# Patient Record
Sex: Female | Born: 1965 | Race: White | Hispanic: No | Marital: Married | Smoking: Never smoker
Health system: Southern US, Community
[De-identification: ages and names within clinical notes are randomized; demographics above are authoritative.]

## PROBLEM LIST (undated history)

## (undated) ENCOUNTER — Emergency Department (HOSPITAL_COMMUNITY): Payer: 59 | Source: Home / Self Care

## (undated) DIAGNOSIS — I219 Acute myocardial infarction, unspecified: Secondary | ICD-10-CM

## (undated) DIAGNOSIS — I2510 Atherosclerotic heart disease of native coronary artery without angina pectoris: Secondary | ICD-10-CM

## (undated) DIAGNOSIS — H9190 Unspecified hearing loss, unspecified ear: Secondary | ICD-10-CM

## (undated) DIAGNOSIS — R7303 Prediabetes: Secondary | ICD-10-CM

## (undated) DIAGNOSIS — H8109 Meniere's disease, unspecified ear: Secondary | ICD-10-CM

## (undated) DIAGNOSIS — E785 Hyperlipidemia, unspecified: Secondary | ICD-10-CM

## (undated) DIAGNOSIS — T7840XA Allergy, unspecified, initial encounter: Secondary | ICD-10-CM

## (undated) HISTORY — PX: PARTIAL HIP ARTHROPLASTY: SHX733

## (undated) HISTORY — PX: COCHLEAR IMPLANT: SUR684

## (undated) HISTORY — PX: OTHER SURGICAL HISTORY: SHX169

## (undated) HISTORY — PX: JOINT REPLACEMENT: SHX530

## (undated) HISTORY — PX: TUBAL LIGATION: SHX77

## (undated) HISTORY — PX: APPENDECTOMY: SHX54

## (undated) HISTORY — DX: Allergy, unspecified, initial encounter: T78.40XA

---

## 2007-09-23 ENCOUNTER — Emergency Department (HOSPITAL_COMMUNITY): Admission: EM | Admit: 2007-09-23 | Discharge: 2007-09-23 | Payer: Self-pay | Admitting: Emergency Medicine

## 2008-10-01 ENCOUNTER — Encounter: Admission: RE | Admit: 2008-10-01 | Discharge: 2008-10-01 | Payer: Self-pay | Admitting: Family Medicine

## 2008-10-27 ENCOUNTER — Encounter: Admission: RE | Admit: 2008-10-27 | Discharge: 2008-10-27 | Payer: Self-pay | Admitting: Gastroenterology

## 2008-11-05 ENCOUNTER — Ambulatory Visit (HOSPITAL_COMMUNITY): Admission: RE | Admit: 2008-11-05 | Discharge: 2008-11-05 | Payer: Self-pay | Admitting: Gastroenterology

## 2008-11-16 ENCOUNTER — Encounter (INDEPENDENT_AMBULATORY_CARE_PROVIDER_SITE_OTHER): Payer: Self-pay | Admitting: Orthopedic Surgery

## 2008-11-19 ENCOUNTER — Inpatient Hospital Stay (HOSPITAL_COMMUNITY): Admission: RE | Admit: 2008-11-19 | Discharge: 2008-11-21 | Payer: Self-pay | Admitting: Orthopedic Surgery

## 2010-02-26 IMAGING — RF DG ESOPHAGUS
20 of 21 series · 20 of 21 positions shown · non-contrast
Comparison: None

CLINICAL DATA: Pain after endoscopy

ESOPHOGRAM / BARIUM SWALLOW
TECHNIQUE: Single contrast examination was performed using and .
Fluoroscopy time:  2.6 minutes.

[Series 1: run · 1 of 1 slices shown (1 of 20)]
[im 1/1]
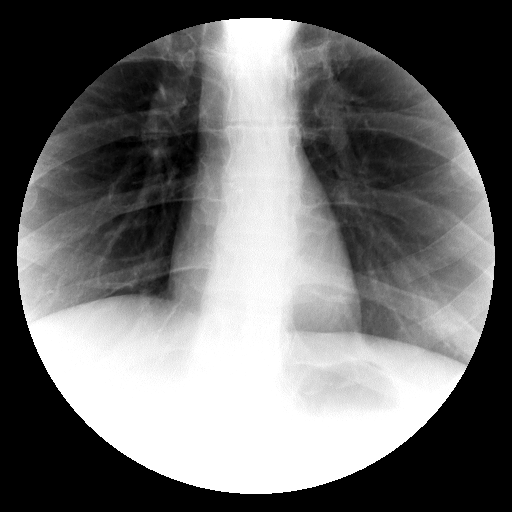

[Series 2: run · 1 of 1 slices shown (2 of 20)]
[im 1/1]
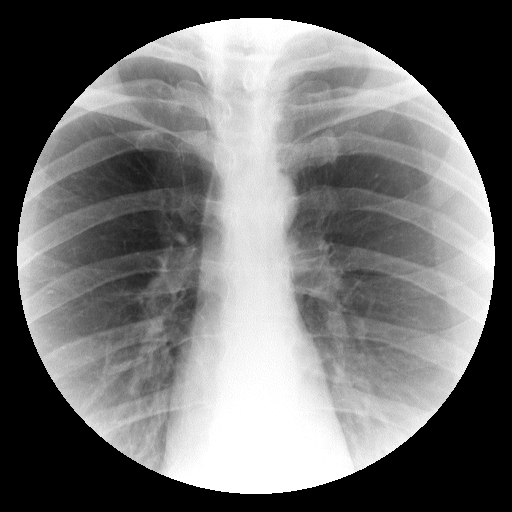

[Series 3: run · 1 of 1 slices shown (3 of 20)]
[im 1/1]
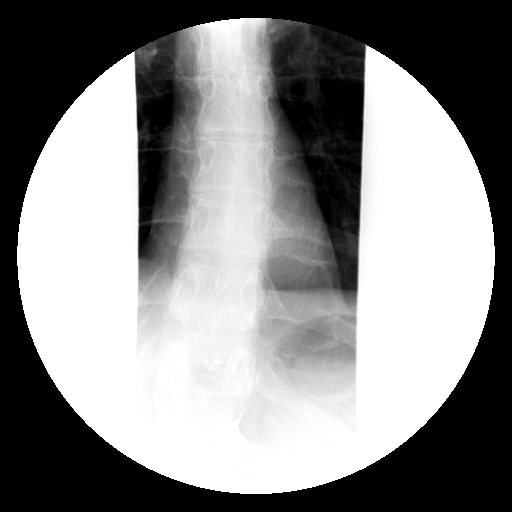

[Series 4: run · 1 of 1 slices shown (4 of 20)]
[im 1/1]
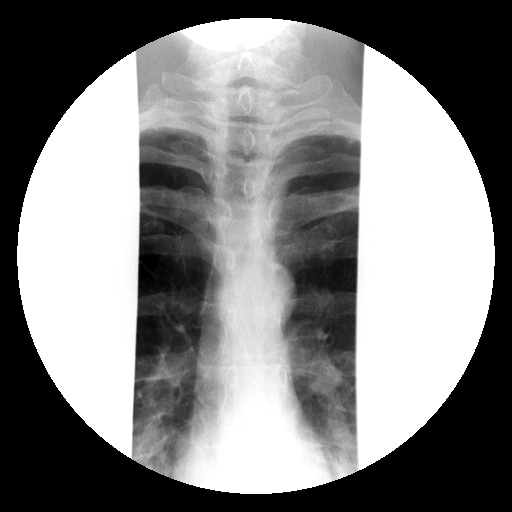

[Series 5: run · 1 of 1 slices shown (5 of 20)]
[im 1/1]
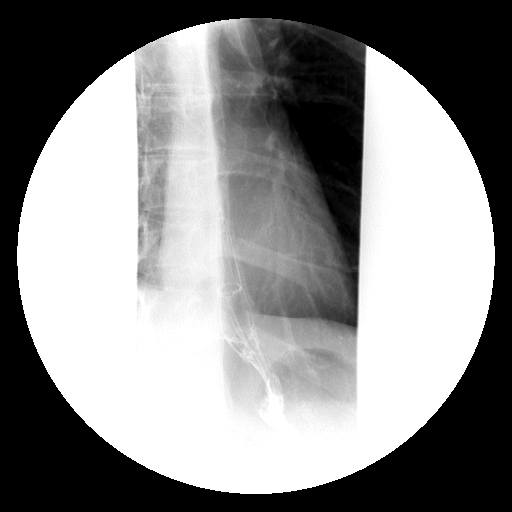

[Series 6: run · 1 of 1 slices shown (6 of 20)]
[im 1/1]
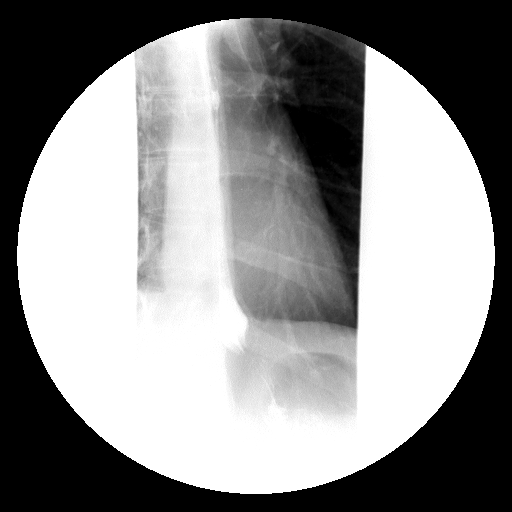

[Series 7: run · 1 of 1 slices shown (7 of 20)]
[im 1/1]
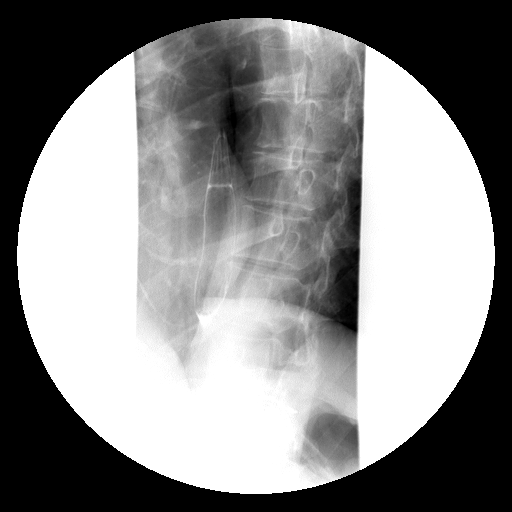

[Series 8: run · 1 of 1 slices shown (8 of 20)]
[im 1/1]
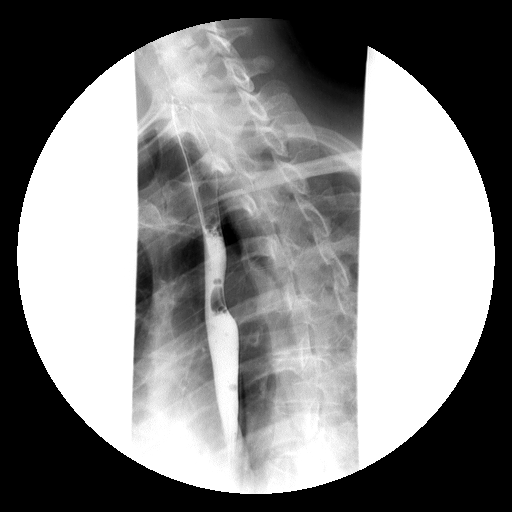

[Series 9: run · 1 of 1 slices shown (9 of 20)]
[im 1/1]
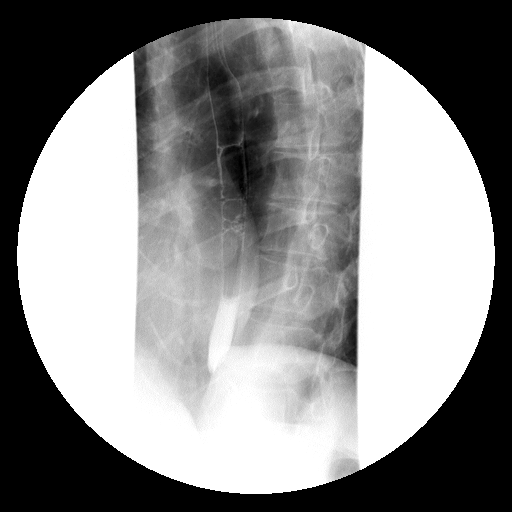

[Series 10: run · 1 of 1 slices shown (10 of 20)]
[im 1/1]
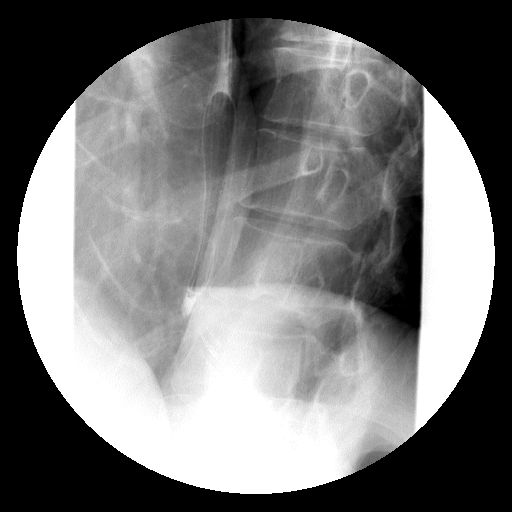

[Series 12: run · 1 of 1 slices shown (11 of 20)]
[im 1/1]
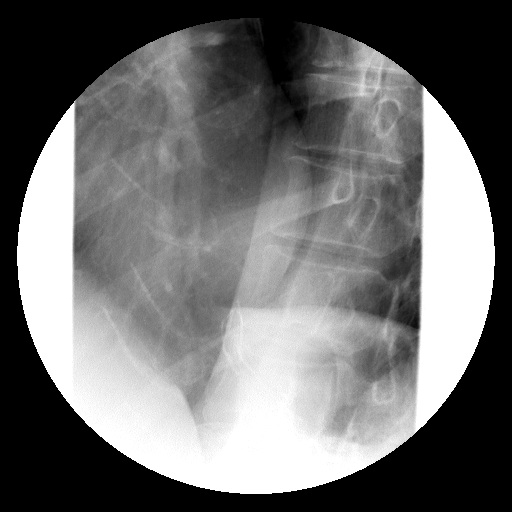

[Series 13: run · 1 of 1 slices shown (12 of 20)]
[im 1/1]
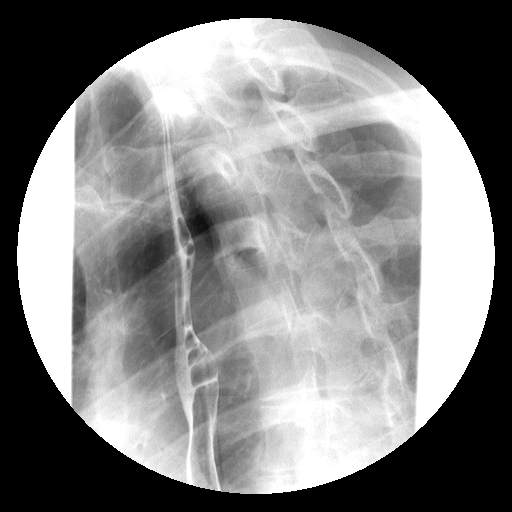

[Series 14: run · 1 of 1 slices shown (13 of 20)]
[im 1/1]
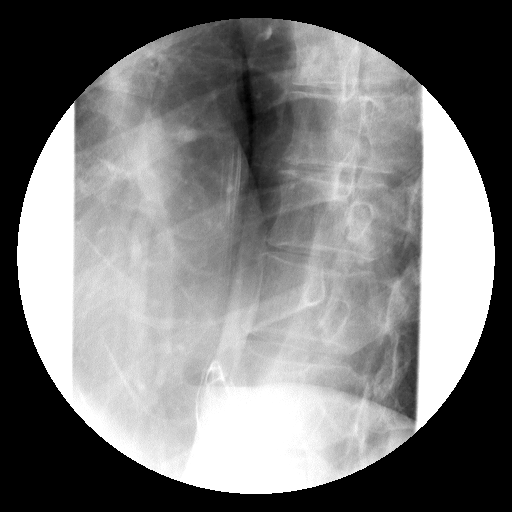

[Series 15: run · 1 of 1 slices shown (14 of 20)]
[im 1/1]
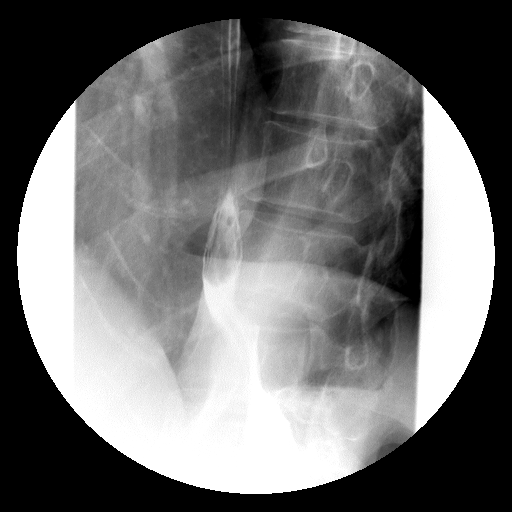

[Series 16: run · 1 of 1 slices shown (15 of 20)]
[im 1/1]
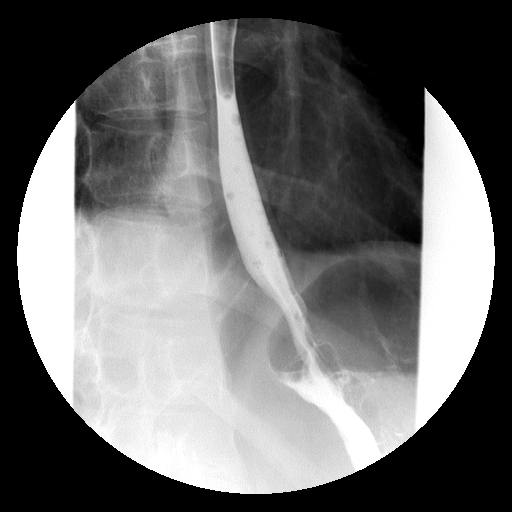

[Series 17: run · 1 of 1 slices shown (16 of 20)]
[im 1/1]
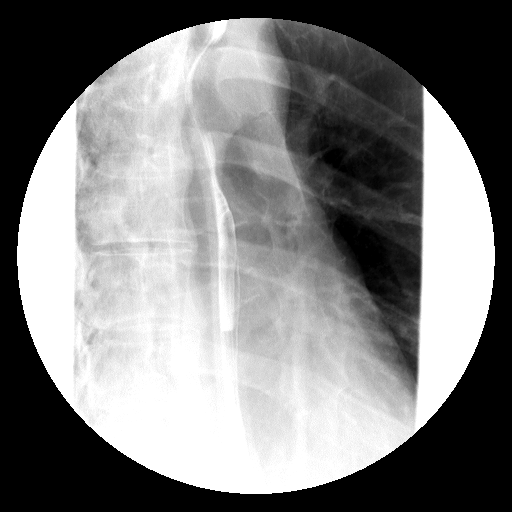

[Series 18: run · 1 of 1 slices shown (17 of 20)]
[im 1/1]
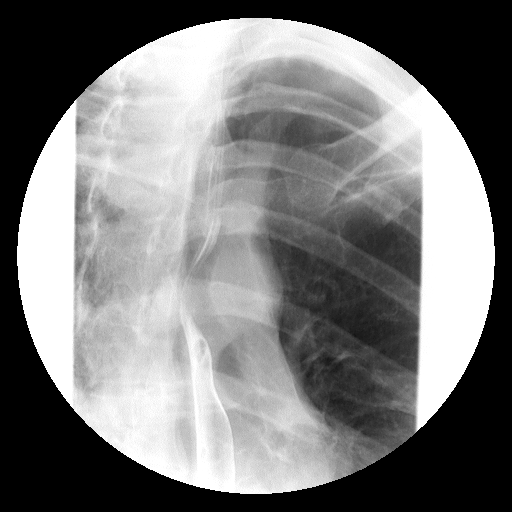

[Series 19: run · 1 of 1 slices shown (18 of 20)]
[im 1/1]
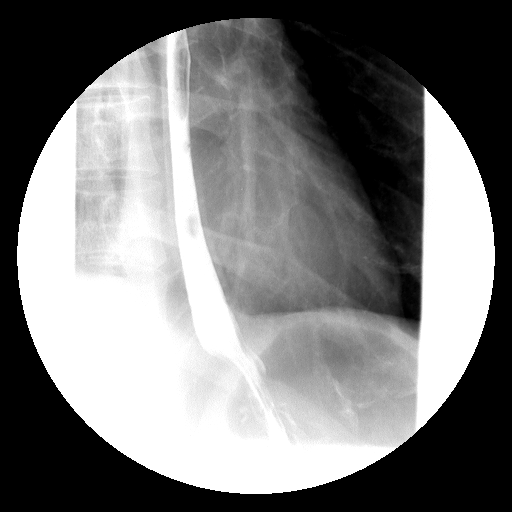

[Series 20: run · 1 of 1 slices shown (19 of 20)]
[im 1/1]
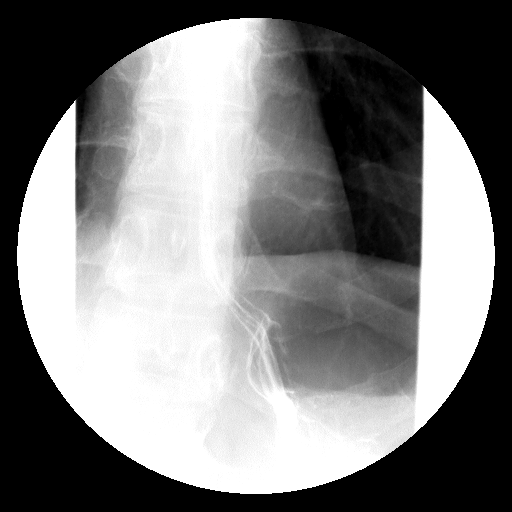

[Series 21: run · 1 of 1 slices shown (20 of 20)]
[im 1/1]
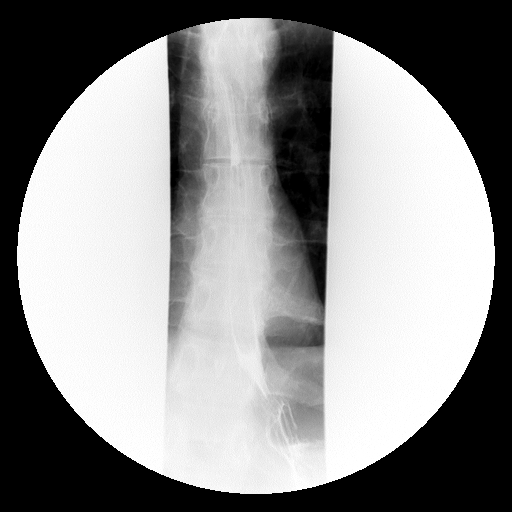

[20 of 21 positions shown; findings below may reference images not displayed]

FINDINGS: The patient was administered water soluble contrast
followed by thin barium.     No evidence of esophageal leak or
perforation.
IMPRESSION: No evidence of esophageal leak or perforation.

## 2010-03-17 IMAGING — CR DG CHEST 2V
2 series · 2 of 2 positions shown · non-contrast
Comparison: None

CLINICAL DATA: preadmit hip surgery.

CHEST - 1 VIEW

[view not recorded (1 of 2)]
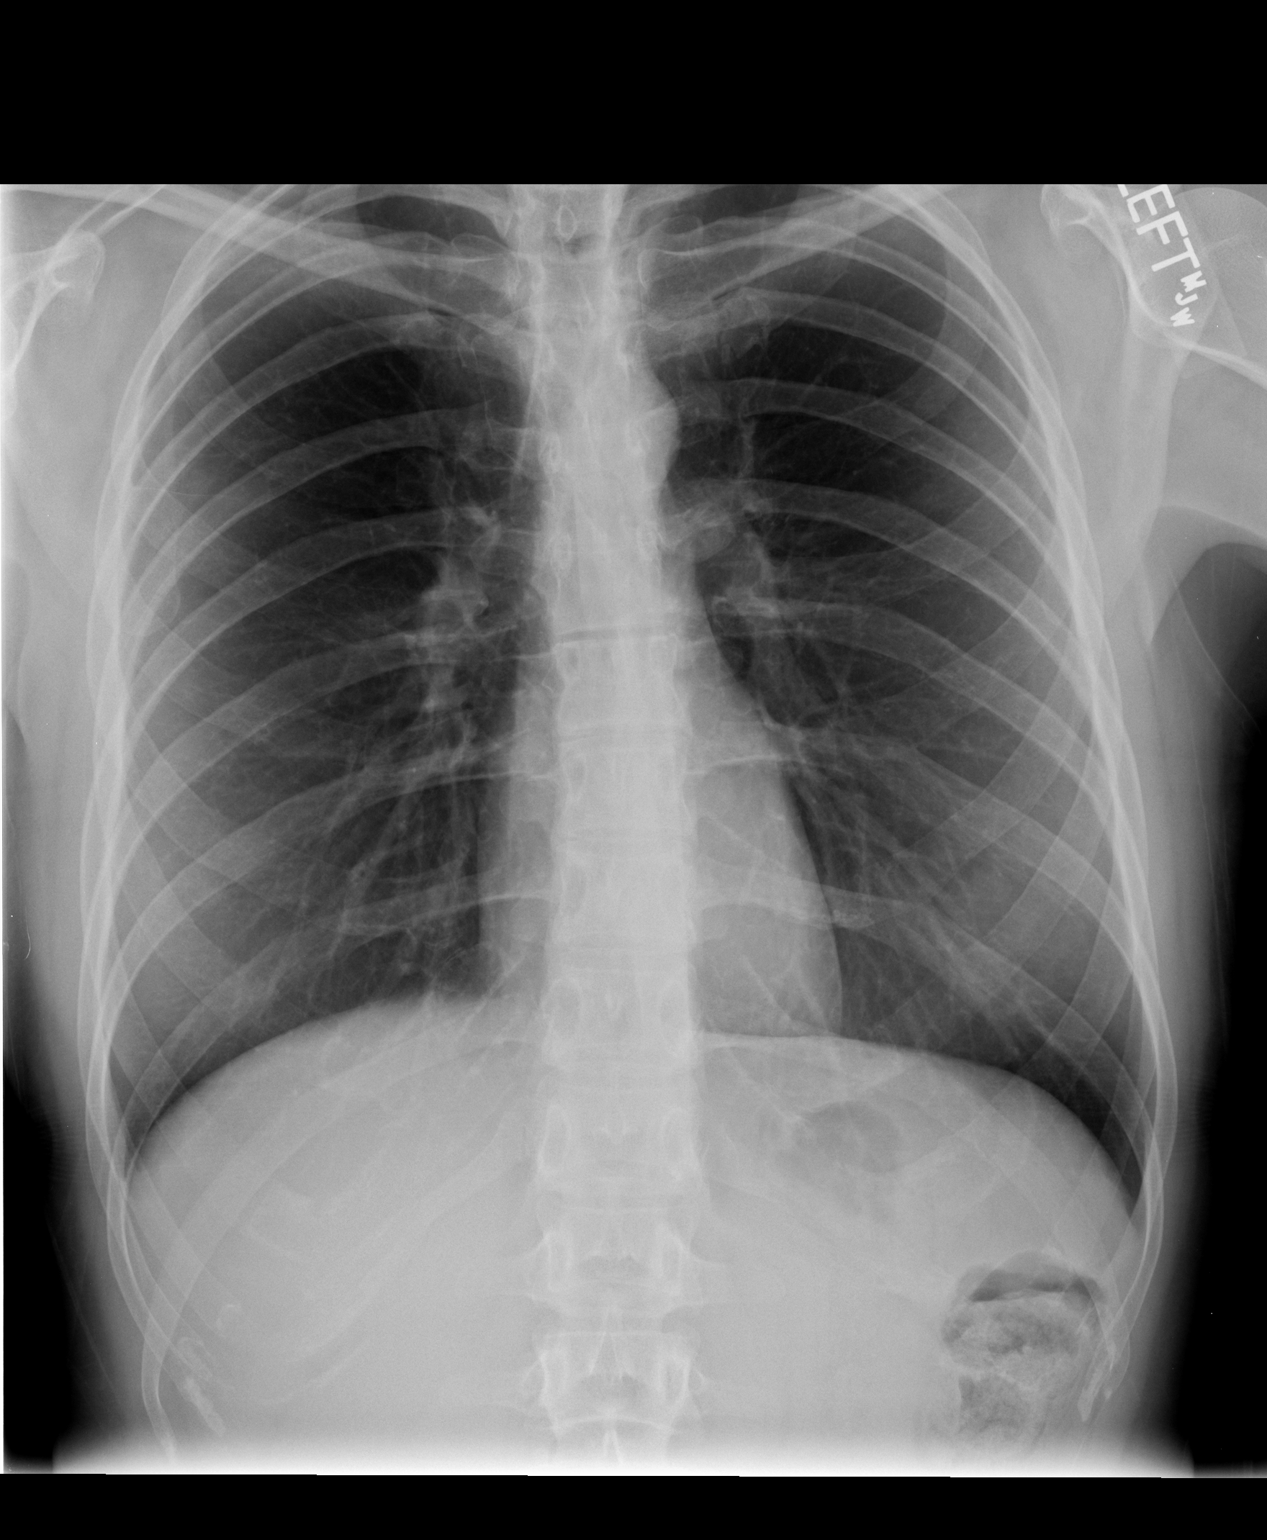

[view not recorded (2 of 2)]
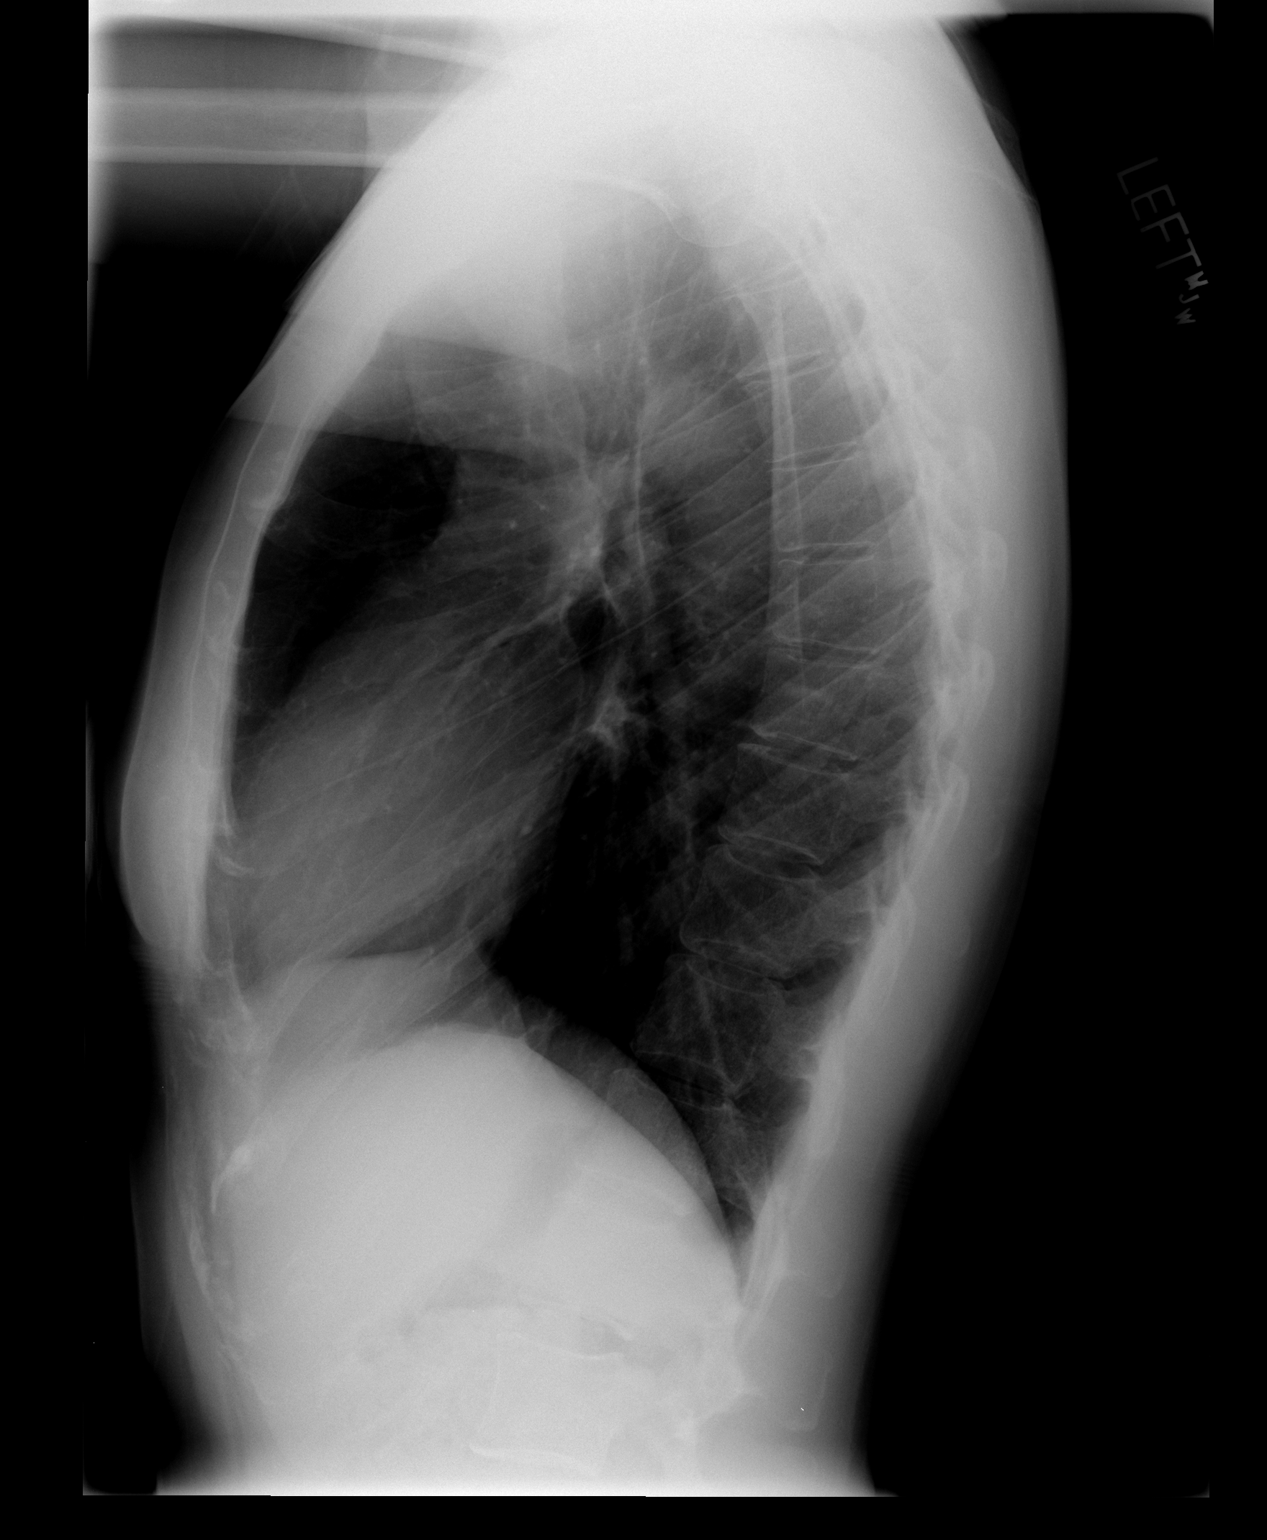

[2 of 2 positions shown; findings below may reference images not displayed]

FINDINGS: The heart size and mediastinal contours are within normal
limits.  Both lungs are clear.
IMPRESSION: No active disease.

## 2010-03-21 IMAGING — CR DG PORTABLE PELVIS
1 series · 1 of 1 positions shown · non-contrast
Comparison: None.

CLINICAL DATA: 42-year-old female status post left total hip
arthroplasty.

PORTABLE PELVIS,
PORTABLE LEFT HIP - 1 VIEW
,

[AP]
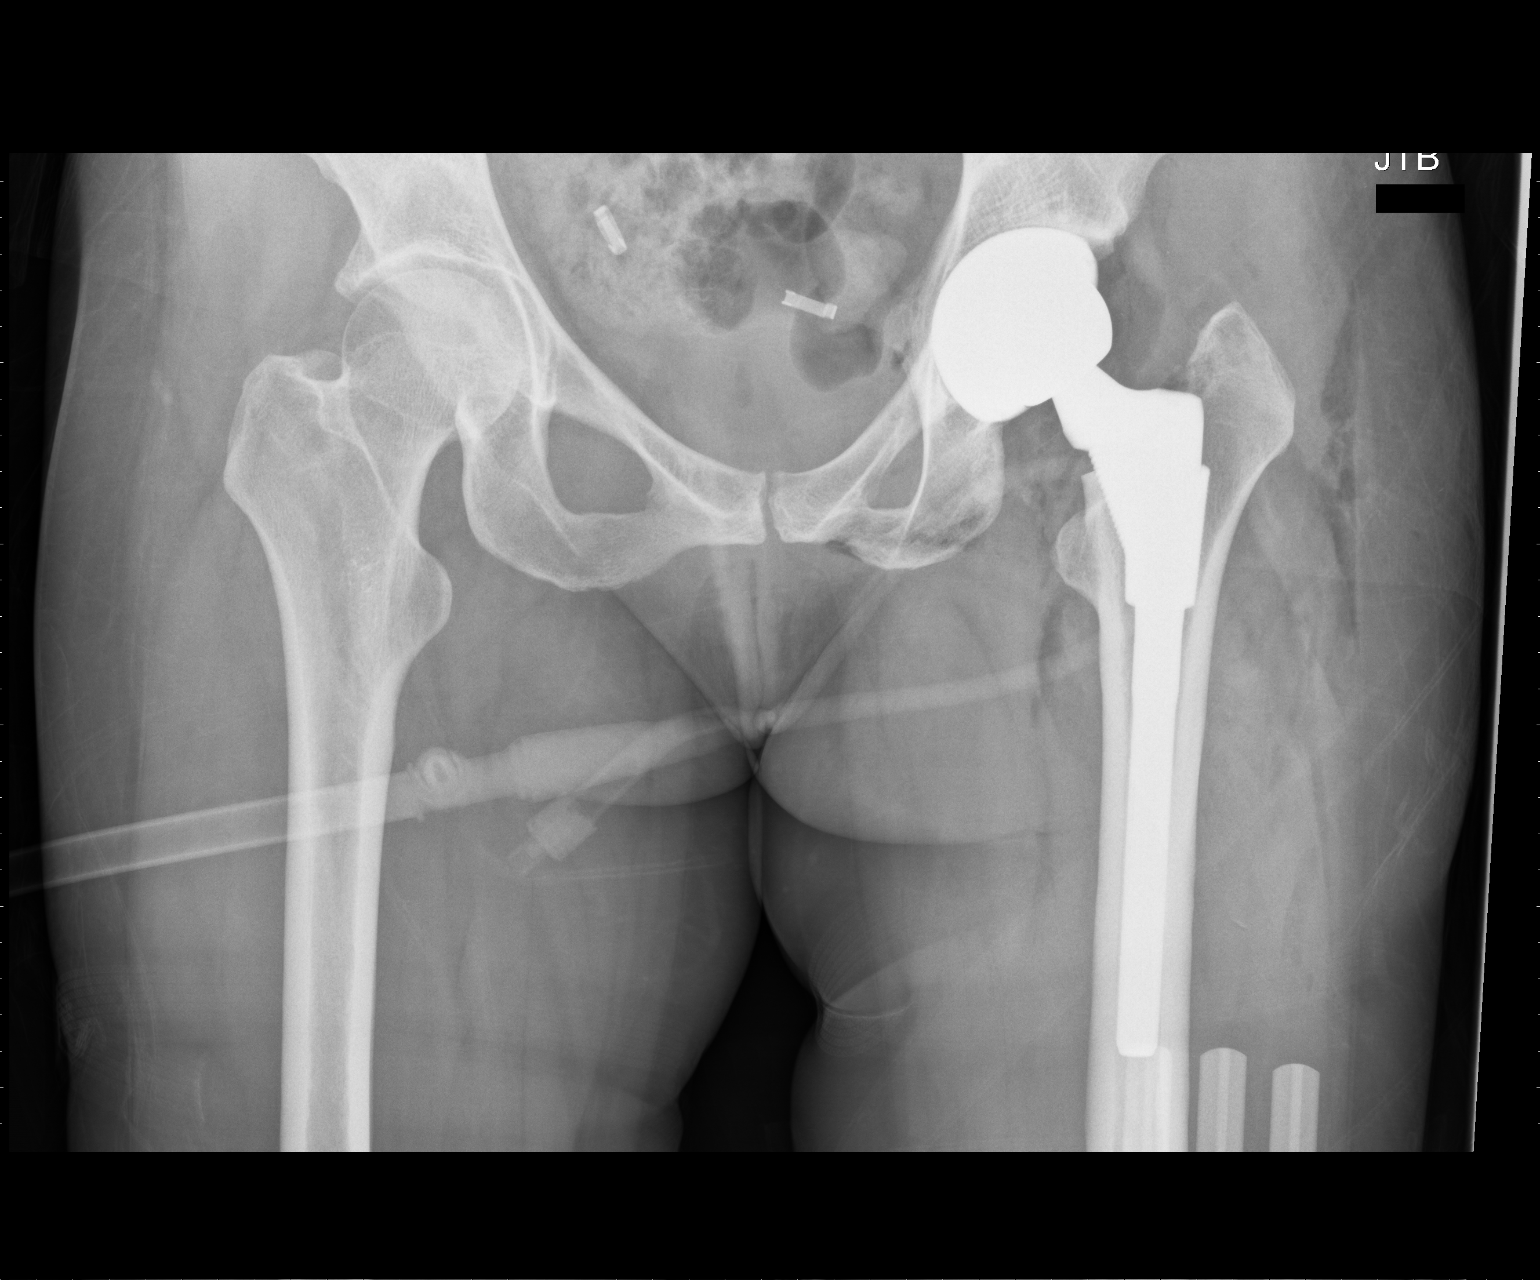

[1 of 1 positions shown; findings below may reference images not displayed]

FINDINGS: AP supine and portable cross-table lateral view views of
the left hip and pelvis.  Postoperative changes about the left hip
with noncemented total arthroplasty components.  Alignment is
within normal limits.   Aside from surgical osteotomy, no acute
osseous abnormality identified.  Visualized pelvis and right femur
appear intact.
IMPRESSION: Left total hip arthroplasty, no adverse features.

## 2010-03-21 IMAGING — CR DG HIP 1V PORT*L*
1 series · 1 of 1 positions shown · non-contrast
Comparison: None.

CLINICAL DATA: 42-year-old female status post left total hip
arthroplasty.

PORTABLE PELVIS,
PORTABLE LEFT HIP - 1 VIEW
,

[cross table lat hip]
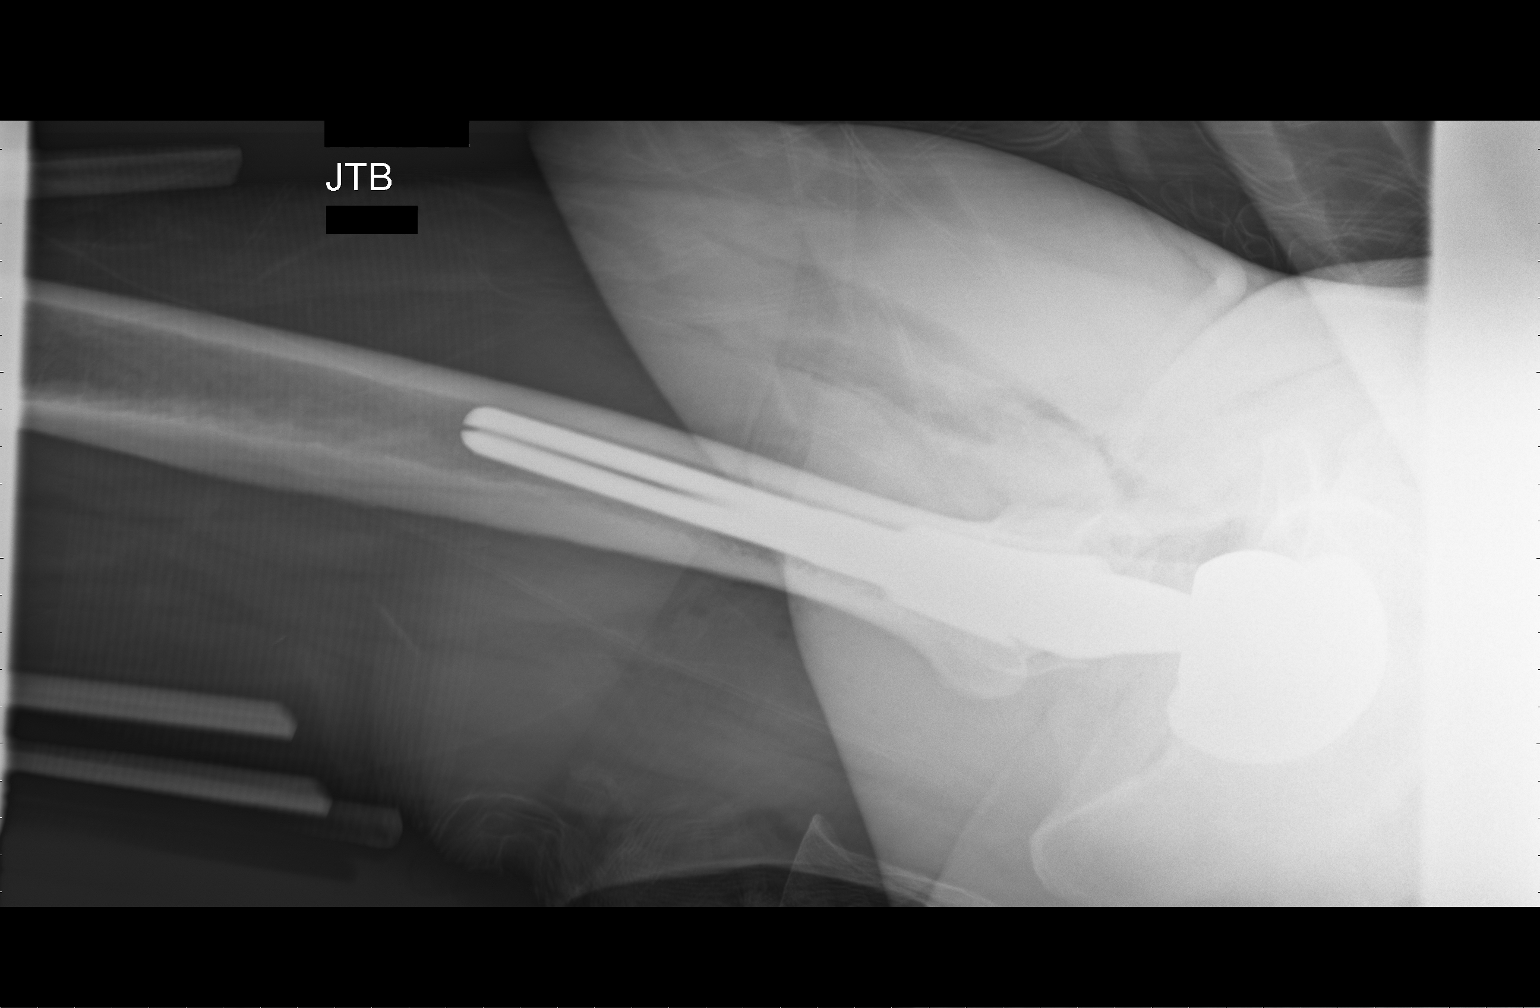

[1 of 1 positions shown; findings below may reference images not displayed]

FINDINGS: AP supine and portable cross-table lateral view views of
the left hip and pelvis.  Postoperative changes about the left hip
with noncemented total arthroplasty components.  Alignment is
within normal limits.   Aside from surgical osteotomy, no acute
osseous abnormality identified.  Visualized pelvis and right femur
appear intact.
IMPRESSION: Left total hip arthroplasty, no adverse features.

## 2010-09-03 ENCOUNTER — Encounter: Payer: Self-pay | Admitting: Emergency Medicine

## 2010-11-22 LAB — URINALYSIS, ROUTINE W REFLEX MICROSCOPIC
Specific Gravity, Urine: 1.017 (ref 1.005–1.030)
Urobilinogen, UA: 0.2 mg/dL (ref 0.0–1.0)
pH: 7 (ref 5.0–8.0)

## 2010-11-22 LAB — CBC
HCT: 29.9 % — ABNORMAL LOW (ref 36.0–46.0)
HCT: 31.9 % — ABNORMAL LOW (ref 36.0–46.0)
HCT: 43.4 % (ref 36.0–46.0)
Hemoglobin: 11.2 g/dL — ABNORMAL LOW (ref 12.0–15.0)
Hemoglobin: 15.3 g/dL — ABNORMAL HIGH (ref 12.0–15.0)
MCHC: 35.2 g/dL (ref 30.0–36.0)
MCV: 94.2 fL (ref 78.0–100.0)
MCV: 94.6 fL (ref 78.0–100.0)
MCV: 94.8 fL (ref 78.0–100.0)
RBC: 3.16 MIL/uL — ABNORMAL LOW (ref 3.87–5.11)
RBC: 4.61 MIL/uL (ref 3.87–5.11)
RDW: 12.8 % (ref 11.5–15.5)
WBC: 5.5 10*3/uL (ref 4.0–10.5)
WBC: 6.2 10*3/uL (ref 4.0–10.5)

## 2010-11-22 LAB — COMPREHENSIVE METABOLIC PANEL
ALT: 14 U/L (ref 0–35)
AST: 20 U/L (ref 0–37)
Albumin: 4.6 g/dL (ref 3.5–5.2)
Alkaline Phosphatase: 49 U/L (ref 39–117)
Calcium: 9.9 mg/dL (ref 8.4–10.5)
Chloride: 101 mEq/L (ref 96–112)
Total Protein: 7.7 g/dL (ref 6.0–8.3)

## 2010-11-22 LAB — URINE MICROSCOPIC-ADD ON

## 2010-11-22 LAB — PROTIME-INR: INR: 1.1 (ref 0.00–1.49)

## 2010-11-22 LAB — BASIC METABOLIC PANEL
BUN: 6 mg/dL (ref 6–23)
CO2: 29 mEq/L (ref 19–32)
CO2: 32 mEq/L (ref 19–32)
Chloride: 102 mEq/L (ref 96–112)
GFR calc Af Amer: >60 mL/min (ref >60)
Glucose, Bld: 111 mg/dL — ABNORMAL HIGH (ref 70–99)
Potassium: 2.9 mEq/L — ABNORMAL LOW (ref 3.5–5.1)
Potassium: 3.2 mEq/L — ABNORMAL LOW (ref 3.5–5.1)
Sodium: 138 mEq/L (ref 135–145)

## 2010-11-22 LAB — DIFFERENTIAL
Eosinophils Absolute: 0 10*3/uL (ref 0.0–0.7)
Eosinophils Relative: 1 % (ref 0–5)
Lymphs Abs: 1.5 10*3/uL (ref 0.7–4.0)
Monocytes Absolute: 0.3 10*3/uL (ref 0.1–1.0)
Monocytes Relative: 6 % (ref 3–12)
Neutrophils Relative %: 65 % (ref 43–77)

## 2010-11-22 LAB — ABO/RH: ABO/RH(D): O POS

## 2010-12-26 NOTE — Op Note (Signed)
Norma Bell, Norma Bell                ACCOUNT NO.:  [id number]   MEDICAL RECORD NO.:  [id number]          PATIENT TYPE:  INP   LOCATION:  5007                         FACILITY:  [facility]   PHYSICIAN:  Harvie Junior, M.D.   DATE OF BIRTH:  08-Feb-1966   DATE OF PROCEDURE:  11/19/2008  DATE OF DISCHARGE:                               OPERATIVE REPORT   PREOPERATIVE DIAGNOSIS:  End-stage degenerative joint disease, left hip.   POSTOPERATIVE DIAGNOSIS:  End-stage degenerative joint disease, left  hip.   PROCEDURE:  Left total hip replacement with a S-ROM system using a 16-11  stem with a 16 D large cone of 36+6 offset stem with a 52-mm cup, 36  metal-on-metal implant with a +0 ball.   SURGEON:  Harvie Junior, MD   ASSISTANT:  Marshia Ly, PA   ANESTHESIA:  General.   BRIEF HISTORY:  Ms. Herrada is a 45 year old female with a long history  of having had significant pain in the left hip.  She had been treated  conservatively for a period of time.  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  We treated  conservatively with antiinflammatory medication, activity modification,  use of a cane.  Because of continued complaints of pain, she was also  taken to the operating room for operative hip replacement.  The S-ROM  system was felt to be critical in this case given that there was some  questionable congenital subluxation of the hip or dislocation in the hip  and there was a situation suggests some  anteversion, I felt the S-ROM  was going to be appropriate.  She was brought to the operating room for  this procedure.   PROCEDURE:  The patient was brought to the operating room.  After  adequate anesthesia was obtained with general anesthetic, the patient  was placed on the operating table.  The patient was then  moved in the  right lateral decubitus position, and all bony prominences were well  padded.  Attention was then turned to the left hip.  After routine prep  and drape, an incision was made for posterior approach of the left hip.  Subcutaneous down to the level of the tensor fascia, which was divided  in line with its fibers and then the posterior aspect of the hip was  approached.  The short external rotators, piriformis was taken down as  well as the capsule and these were all tagged.  At this point, the hip  was dislocated.  A provisional neck cut was made, attention was then  turned to the acetabulum and sequentially reamed to a level of 51 and at  that point a 50-mm trial was used, it seated nicely, 52 mm hung up on  the rim some.  At this point, a 52-mm cup was placed.  At this point, it  was clear that there was some  native retroversion of the acetabulum.  We did put a little bit of anteversion into the cuff relative to the  native acetabulum.  Once this was finished, a trial metal liner was put  in place.  Attention was turned to the stem size sequentially reamed up  to a level of 11 and got good bone and biting on 11, 11/5 was taken down  three-quarters of the way.  Attention was then turned to the placement  of the cone, which was reamed up to 16 was used and 11 distally and we  went to a 16 D large proximal fit and trial cone was then placed, 16  stem 36+ with 6 offset initially with a +0 ball.  We initially used a  36+ with no offset and a +0 ball.  There was a tendency towards anterior  dislocation.  This was leveraging off the back, then we went to a 36 +6  offset with a +0 ball.  Leg lengths seemed appropriate at that point.  There was still a question about there was something prominent in the  front, so we retroverted the stem relative to the cone 20 degrees, which  still left [place] with about 60 degrees of anteversion in the stem.  Once  this was completed, the hip was put  through a range of motion, stable in  all directions, no tendency towards dislocation.  At this point, trial  components were removed.  The trial liner was removed.  The final metal  cup was put in place after the hole was illuminated, once this was  completed, attention was turned up into the stem side where we put in a  final 16 D large cone followed with a 16-11 stem with a 36+6 offset and  then we used a 36 mm ball to match the cup and liner.  At this point, it  was put through a range of motion initially with a trial ball stable in  all directions, excellent leg length.  The wound was copiously and  thoroughly irrigated at this time and suctioned dry.  The short external  rotators, piriformis were reattached to the posterior intertrochanteric  line through drill holes and suture.  Tensor fascia was closed with 1  Vicryl running, skin with 2-0 Vicryl, and 3-0 Monocryl subcuticular  stitching.  Benzoin and Steri-Strips were applied.  Sterile compressive  dressing was applied as well knee immobilizer.  The patient was taken to  recovery room and was noted to be in satisfactory condition.  Estimated  blood loss for the procedure was 300 mL.      Harvie Junior, M.D.  Electronically Signed     JLG/MEDQ  D:  11/19/2008  T:  11/20/2008  Job:  [id number]

## 2011-03-27 ENCOUNTER — Other Ambulatory Visit: Payer: Self-pay | Admitting: Family Medicine

## 2011-03-27 ENCOUNTER — Ambulatory Visit
Admission: RE | Admit: 2011-03-27 | Discharge: 2011-03-27 | Disposition: A | Discharge status: Home / Self Care | Payer: BC Managed Care – PPO | Source: Ambulatory Visit | Attending: Family Medicine | Admitting: Family Medicine

## 2011-03-27 DIAGNOSIS — R220 Localized swelling, mass and lump, head: Secondary | ICD-10-CM

## 2011-03-27 MED ORDER — IOHEXOL 300 MG/ML  SOLN: 75.0000 mL | Freq: Once | INTRAMUSCULAR | Status: AC | PRN

## 2012-03-18 ENCOUNTER — Other Ambulatory Visit: Payer: Self-pay | Admitting: Family Medicine

## 2012-03-18 NOTE — Telephone Encounter (Signed)
Please pull chart.

## 2012-03-21 NOTE — Telephone Encounter (Signed)
Chart pulled [id number]

## 2012-03-24 ENCOUNTER — Other Ambulatory Visit: Payer: Self-pay | Admitting: Family Medicine

## 2012-04-08 ENCOUNTER — Ambulatory Visit (INDEPENDENT_AMBULATORY_CARE_PROVIDER_SITE_OTHER): Payer: BC Managed Care – PPO | Admitting: Family Medicine

## 2012-04-08 ENCOUNTER — Encounter: Payer: Self-pay | Admitting: Family Medicine

## 2012-04-08 VITALS — BP 122/74 | HR 98 | Temp 99.0°F | Resp 16 | Ht 66.5 in | Wt 194.6 lb

## 2012-04-08 DIAGNOSIS — E785 Hyperlipidemia, unspecified: Secondary | ICD-10-CM | POA: Insufficient documentation

## 2012-04-08 DIAGNOSIS — H8109 Meniere's disease, unspecified ear: Secondary | ICD-10-CM | POA: Insufficient documentation

## 2012-04-08 DIAGNOSIS — R635 Abnormal weight gain: Secondary | ICD-10-CM

## 2012-04-08 LAB — LDL CHOLESTEROL, DIRECT: Direct LDL: 125 mg/dL — ABNORMAL HIGH

## 2012-04-08 LAB — T4, FREE: Free T4: 0.95 ng/dL (ref 0.80–1.80)

## 2012-04-08 LAB — ALT: ALT: 30 U/L (ref 0–35)

## 2012-04-08 MED ORDER — PRAVASTATIN SODIUM 40 MG PO TABS: 40.0000 mg | ORAL_TABLET | Freq: Every day | ORAL | Status: DC

## 2012-04-08 NOTE — Patient Instructions (Signed)
Hypertriglyceridemia  Diet for High blood levels of Triglycerides Most fats in food are triglycerides. Triglycerides in your blood are stored as fat in your body. High levels of triglycerides in your blood may put you at a greater risk for heart disease and stroke.  Normal triglyceride levels are less than 150 mg/dL. Borderline high levels are 150-199 mg/dl. High levels are 200 - 499 mg/dL, and very high triglyceride levels are greater than 500 mg/dL. The decision to treat high triglycerides is generally based on the level. For people with borderline or high triglyceride levels, treatment includes weight loss and exercise. Drugs are recommended for people with very high triglyceride levels. Many people who need treatment for high triglyceride levels have metabolic syndrome. This syndrome is a collection of disorders that often include: insulin resistance, high blood pressure, blood clotting problems, high cholesterol and triglycerides. TESTING PROCEDURE FOR TRIGLYCERIDES  You should not eat 4 hours before getting your triglycerides measured. The normal range of triglycerides is between 10 and 250 milligrams per deciliter (mg/dl). Some people may have extreme levels (1000 or above), but your triglyceride level may be too high if it is above 150 mg/dl, depending on what other risk factors you have for heart disease.   People with high blood triglycerides may also have high blood cholesterol levels. If you have high blood cholesterol as well as high blood triglycerides, your risk for heart disease is probably greater than if you only had high triglycerides. High blood cholesterol is one of the main risk factors for heart disease.  CHANGING YOUR DIET  Your weight can affect your blood triglyceride level. If you are more than 20% above your ideal body weight, you may be able to lower your blood triglycerides by losing weight. Eating less and exercising regularly is the best way to combat this. Fat provides  more calories than any other food. The best way to lose weight is to eat less fat. Only 30% of your total calories should come from fat. Less than 7% of your diet should come from saturated fat. A diet low in fat and saturated fat is the same as a diet to decrease blood cholesterol. By eating a diet lower in fat, you may lose weight, lower your blood cholesterol, and lower your blood triglyceride level.  Eating a diet low in fat, especially saturated fat, may also help you lower your blood triglyceride level. Ask your dietitian to help you figure how much fat you can eat based on the number of calories your caregiver has prescribed for you.  Exercise, in addition to helping with weight loss may also help lower triglyceride levels.   Alcohol can increase blood triglycerides. You may need to stop drinking alcoholic beverages.   Too much carbohydrate in your diet may also increase your blood triglycerides. Some complex carbohydrates are necessary in your diet. These may include bread, rice, potatoes, other starchy vegetables and cereals.   Reduce "simple" carbohydrates. These may include pure sugars, candy, honey, and jelly without losing other nutrients. If you have the kind of high blood triglycerides that is affected by the amount of carbohydrates in your diet, you will need to eat less sugar and less high-sugar foods. Your caregiver can help you with this.   Adding 2-4 grams of fish oil (EPA+ DHA) may also help lower triglycerides. Speak with your caregiver before adding any supplements to your regimen.  Following the Diet  Maintain your ideal weight. Your caregivers can help you with a diet. Generally,   eating less food and getting more exercise will help you lose weight. Joining a weight control group may also help. Ask your caregivers for a good weight control group in your area.  Eat low-fat foods instead of high-fat foods. This can help you lose weight too.  These foods are lower in fat. Eat MORE  of these:   Dried beans, peas, and lentils.   Egg whites.   Low-fat cottage cheese.   Fish.   Lean cuts of meat, such as round, sirloin, rump, and flank (cut extra fat off meat you fix).   Whole grain breads, cereals and pasta.   Skim and nonfat dry milk.   Low-fat yogurt.   Poultry without the skin.   Cheese made with skim or part-skim milk, such as mozzarella, parmesan, farmers', ricotta, or pot cheese.  These are higher fat foods. Eat LESS of these:   Whole milk and foods made from whole milk, such as American, blue, cheddar, monterey jack, and swiss cheese   High-fat meats, such as luncheon meats, sausages, knockwurst, bratwurst, hot dogs, ribs, corned beef, ground pork, and regular ground beef.   Fried foods.  Limit saturated fats in your diet. Substituting unsaturated fat for saturated fat may decrease your blood triglyceride level. You will need to read package labels to know which products contain saturated fats.  These foods are high in saturated fat. Eat LESS of these:   Fried pork skins.   Whole milk.   Skin and fat from poultry.   Palm oil.   Butter.   Shortening.   Cream cheese.   Bacon.   Margarines and baked goods made from listed oils.   Vegetable shortenings.   Chitterlings.   Fat from meats.   Coconut oil.   Palm kernel oil.   Lard.   Cream.   Sour cream.   Fatback.   Coffee whiteners and non-dairy creamers made with these oils.   Cheese made from whole milk.  Use unsaturated fats (both polyunsaturated and monounsaturated) moderately. Remember, even though unsaturated fats are better than saturated fats; you still want a diet low in total fat.  These foods are high in unsaturated fat:   Canola oil.   Sunflower oil.   Mayonnaise.   Almonds.   Peanuts.   Pine nuts.   Margarines made with these oils.   Safflower oil.   Olive oil.   Avocados.   Cashews.   Peanut butter.   Sunflower seeds.   Soybean oil.     Peanut oil.   Olives.   Pecans.   Walnuts.   Pumpkin seeds.  Avoid sugar and other high-sugar foods. This will decrease carbohydrates without decreasing other nutrients. Sugar in your food goes rapidly to your blood. When there is excess sugar in your blood, your liver may use it to make more triglycerides. Sugar also contains calories without other important nutrients.  Eat LESS of these:   Sugar, brown sugar, powdered sugar, jam, jelly, preserves, honey, syrup, molasses, pies, candy, cakes, cookies, frosting, pastries, colas, soft drinks, punches, fruit drinks, and regular gelatin.   Avoid alcohol. Alcohol, even more than sugar, may increase blood triglycerides. In addition, alcohol is high in calories and low in nutrients. Ask for sparkling water, or a diet soft drink instead of an alcoholic beverage.  Suggestions for planning and preparing meals   Bake, broil, grill or roast meats instead of frying.   Remove fat from meats and skin from poultry before cooking.   Add spices,   herbs, lemon juice or vinegar to vegetables instead of salt, rich sauces or gravies.   Use a non-stick skillet without fat or use no-stick sprays.   Cool and refrigerate stews and broth. Then remove the hardened fat floating on the surface before serving.   Refrigerate meat drippings and skim off fat to make low-fat gravies.   Serve more fish.   Use less butter, margarine and other high-fat spreads on bread or vegetables.   Use skim or reconstituted non-fat dry milk for cooking.   Cook with low-fat cheeses.   Substitute low-fat yogurt or cottage cheese for all or part of the sour cream in recipes for sauces, dips or congealed salads.   Use half yogurt/half mayonnaise in salad recipes.   Substitute evaporated skim milk for cream. Evaporated skim milk or reconstituted non-fat dry milk can be whipped and substituted for whipped cream in certain recipes.   Choose fresh fruits for dessert instead of  high-fat foods such as pies or cakes. Fruits are naturally low in fat.  When Dining Out   Order low-fat appetizers such as fruit or vegetable juice, pasta with vegetables or tomato sauce.   Select clear, rather than cream soups.   Ask that dressings and gravies be served on the side. Then use less of them.   Order foods that are baked, broiled, poached, steamed, stir-fried, or roasted.   Ask for margarine instead of butter, and use only a small amount.   Drink sparkling water, unsweetened tea or coffee, or diet soft drinks instead of alcohol or other sweet beverages.  QUESTIONS AND ANSWERS ABOUT OTHER FATS IN THE BLOOD: SATURATED FAT, TRANS FAT, AND CHOLESTEROL What is trans fat? Trans fat is a type of fat that is formed when vegetable oil is hardened through a process called hydrogenation. This process helps makes foods more solid, gives them shape, and prolongs their shelf life. Trans fats are also called hydrogenated or partially hydrogenated oils.  What do saturated fat, trans fat, and cholesterol in foods have to do with heart disease? Saturated fat, trans fat, and cholesterol in the diet all raise the level of LDL "bad" cholesterol in the blood. The higher the LDL cholesterol, the greater the risk for coronary heart disease (CHD). Saturated fat and trans fat raise LDL similarly.  What foods contain saturated fat, trans fat, and cholesterol? High amounts of saturated fat are found in animal products, such as fatty cuts of meat, chicken skin, and full-fat dairy products like butter, whole milk, cream, and cheese, and in tropical vegetable oils such as palm, palm kernel, and coconut oil. Trans fat is found in some of the same foods as saturated fat, such as vegetable shortening, some margarines (especially hard or stick margarine), crackers, cookies, baked goods, fried foods, salad dressings, and other processed foods made with partially hydrogenated vegetable oils. Small amounts of trans fat  also occur naturally in some animal products, such as milk products, beef, and lamb. Foods high in cholesterol include liver, other organ meats, egg yolks, shrimp, and full-fat dairy products. How can I use the new food label to make heart-healthy food choices? Check the Nutrition Facts panel of the food label. Choose foods lower in saturated fat, trans fat, and cholesterol. For saturated fat and cholesterol, you can also use the Percent Daily Value (%DV): 5% DV or less is low, and 20% DV or more is high. (There is no %DV for trans fat.) Use the Nutrition Facts panel to choose foods low in   saturated fat and cholesterol, and if the trans fat is not listed, read the ingredients and limit products that list shortening or hydrogenated or partially hydrogenated vegetable oil, which tend to be high in trans fat. POINTS TO REMEMBER: YOU NEED A LITTLE TLC (THERAPEUTIC LIFESTYLE CHANGES)  Discuss your risk for heart disease with your caregivers, and take steps to reduce risk factors.   Change your diet. Choose foods that are low in saturated fat, trans fat, and cholesterol.   Add exercise to your daily routine if it is not already being done. Participate in physical activity of moderate intensity, like brisk walking, for at least 30 minutes on most, and preferably all days of the week. No time? Break the 30 minutes into three, 10-minute segments during the day.   Stop smoking. If you do smoke, contact your caregiver to discuss ways in which they can help you quit.   Do not use street drugs.   Maintain a normal weight.   Maintain a healthy blood pressure.   Keep up with your blood work for checking the fats in your blood as directed by your caregiver.  Document Released: 05/17/2004 Document Revised: 07/19/2011 Document Reviewed: 12/13/2008 [employer] Patient Information 2012 [employer]. 

## 2012-04-08 NOTE — Progress Notes (Signed)
S: This 46 y.o. Cauc female has chronic severe hyperlipidemia diagnosed > 7 years ago. She is taking Pravastatin 40 mg without side effects. She had lipid profile checked in January as part of health screening with husband's insurance and values are still quite high. Despite changing nutrition, she has gained weight due to inactivity (is taking online classes through [employer].) Her sister was recently diagnosed with hypothyroidism.  O:  Filed Vitals:   04/08/12 1423  BP: 122/74                                             Weight up 20 lbs in 12 months  Pulse: 98  Temp: 99 F (37.2 C)  Resp: 16   GEN: In NAD; WN,WD HENT: [place]/AT; EOMI  With clear conj/sclerae COR: RRR LUNGS: Normal resp rate and effort NEURO: A&O x 3; CNs intact except impaired hearing.   A/P:    1. Hyperlipidemia  TSH, T4, Free, LDL Cholesterol, Direct, Vitamin D, 25-hydroxy, ALT Continue Pravastatin 40 mg  1 tablet every PM after meal Fish oil capsule once a day  2. Weight gain  Encouraged increased activity for better cardiovascular health and weight reduction   RTC in 4 months for CPE; pt had PAP earlier this year at [facility].

## 2012-04-09 LAB — VITAMIN D 25 HYDROXY (VIT D DEFICIENCY, FRACTURES): Vit D, 25-Hydroxy: 25 ng/mL — ABNORMAL LOW (ref 30–89)

## 2012-04-09 NOTE — Progress Notes (Signed)
Quick Note:  Please call pt/ husband and advise her husband that the following labs are abnormal... Thyroid tests are normal. LDL ("bad") cholesterol is elevated; continue taking Pravastatin 40 mg every evening after meal; try to eat as healthy as you can (more fruits, vegetables, whole grains, nuts like walnuts/ almonds/ cashews) and  get more exercise. Liver test is normal so current medication is not affecting your liver.  Vitamin D is below normal; as we discussed, get over-the-counter Vit D 2000 IU and take 1 capsule daily. Try to get 10-15 minutes of sun exposure most days of the week.  Copy to pt.  ______

## 2012-08-19 ENCOUNTER — Ambulatory Visit (INDEPENDENT_AMBULATORY_CARE_PROVIDER_SITE_OTHER): Payer: BC Managed Care – PPO | Admitting: Family Medicine

## 2012-08-19 ENCOUNTER — Encounter: Payer: Self-pay | Admitting: Family Medicine

## 2012-08-19 VITALS — BP 130/82 | HR 93 | Temp 98.8°F | Resp 16 | Ht 67.0 in | Wt 203.6 lb

## 2012-08-19 DIAGNOSIS — Z9621 Cochlear implant status: Secondary | ICD-10-CM | POA: Insufficient documentation

## 2012-08-19 DIAGNOSIS — E669 Obesity, unspecified: Secondary | ICD-10-CM | POA: Insufficient documentation

## 2012-08-19 DIAGNOSIS — E559 Vitamin D deficiency, unspecified: Secondary | ICD-10-CM

## 2012-08-19 DIAGNOSIS — E785 Hyperlipidemia, unspecified: Secondary | ICD-10-CM

## 2012-08-19 DIAGNOSIS — Z8639 Personal history of other endocrine, nutritional and metabolic disease: Secondary | ICD-10-CM

## 2012-08-19 DIAGNOSIS — R112 Nausea with vomiting, unspecified: Secondary | ICD-10-CM

## 2012-08-19 LAB — LIPID PANEL: Triglycerides: 437 mg/dL — ABNORMAL HIGH (ref <150)

## 2012-08-19 LAB — COMPREHENSIVE METABOLIC PANEL
ALT: 40 U/L — ABNORMAL HIGH (ref 0–35)
Alkaline Phosphatase: 59 U/L (ref 39–117)
Sodium: 135 mEq/L (ref 135–145)
Total Bilirubin: 0.4 mg/dL (ref 0.3–1.2)
Total Protein: 7.5 g/dL (ref 6.0–8.3)

## 2012-08-19 LAB — POCT GLYCOSYLATED HEMOGLOBIN (HGB A1C): Hemoglobin A1C: 5.3

## 2012-08-19 NOTE — Progress Notes (Signed)
  Subjective:    Patient ID: Norma Bell, female    DOB: [birth year], 46 y.o.   MRN: [id number]  HPI  This 47 y.o. Cauc female has hyperlipidemia (probably familial) who is taking Pravastatin. She could not tolerate Fish Oil and has found out that she has several food allergies that include oranges and grapes, preservatives and additives. She is concerned about weight gain but understands that she is perimenopausal (having hot flushing) and is not active enough to keep weight down. 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  She also is having a lot of nausea and vomiting (up to 4x daily); has had EGD and nothing was seen that would explain this problem. Again, she thinks it goes back to food intolerances and allergies. Pt reports normal BMs.   Review of Systems  Constitutional: Positive for activity change and appetite change. Negative for fever, diaphoresis and fatigue.  HENT: Negative.   Respiratory: Negative.   Cardiovascular: Negative.   Gastrointestinal: Positive for nausea and vomiting. Negative for abdominal pain, diarrhea, constipation, blood in stool and abdominal distention.  Genitourinary:       Menopause symptoms  Musculoskeletal: Negative.   Neurological: Negative.        Objective:   Physical Exam  Nursing note and vitals reviewed. Constitutional: She is oriented to person, place, and time. She appears well-developed and well-nourished. No distress.  HENT:  Head: Normocephalic and atraumatic.  Right Ear: External ear normal.  Left Ear: External ear normal.  Eyes: EOM are normal. Pupils are equal, round, and reactive to light. No scleral icterus.  Neck: Normal range of motion. Neck supple. No thyromegaly present.  Cardiovascular: Normal rate and regular rhythm.   Pulmonary/Chest: Effort normal and breath sounds normal. No respiratory distress.  Abdominal: Soft. Bowel sounds are normal. She exhibits no distension and no mass. There is no  hepatosplenomegaly. There is no tenderness. There is no guarding and no CVA tenderness.  Musculoskeletal: Normal range of motion. She exhibits no edema.  Lymphadenopathy:    She has no cervical adenopathy.  Neurological: She is alert and oriented to person, place, and time. No cranial nerve deficit. She exhibits normal muscle tone.       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 No erythema. No pallor.  Psychiatric: She has a normal mood and affect. Her behavior is normal.    Results for orders placed in visit on 08/19/12  POCT GLYCOSYLATED HEMOGLOBIN (HGB A1C)      Component Value Range   Hemoglobin A1C 5.3           Assessment & Plan:   1. Hyperlipidemia LDL goal < 100  Comprehensive metabolic panel, Lipid panel, POCT glycosylated hemoglobin (Hb A1C) Continue Pravastatin 40 mg daily; try OTC Flax Seed Oil capsules 1200 mg daily  2. Unspecified vitamin D deficiency  Vitamin D, 25-hydroxy OTC Vit D3 2000 IU daily  3. H/O hyperglycemia  Pt advised of risk of Diabetes related to weight gain and lipid disorder.She will work on increasing activity level and healthier nutrition.  4. Nausea and vomiting in adult  May be related to food intolerances; pt will monitor this (though is has been evaluated by GI in past).

## 2012-08-19 NOTE — Patient Instructions (Signed)
I would encourage you to eat as healthy as you can while avoiding those foods and additives that do not agree with you. You do not have Diabetes at this time biut without weight loss, you could develop it in a few years. There are currently millions of Americans that do not know they have Diabetes or are at high risk, Try to be more active and try Flax Seed Oil capsules 1200 mg 1 capsule daily to help reduce lipid levels.

## 2012-08-20 ENCOUNTER — Encounter: Payer: Self-pay | Admitting: Family Medicine

## 2012-08-20 NOTE — Progress Notes (Signed)
Quick Note:  Please call pt and advise that the following labs are abnormal... Your chemistries (CMET- comprehensive metabolic panel) shows a slight elevation of liver function tests. Given your history of nausea/ vomiting, it would be reasonable to do an abdominal ultrasound to look at your gallbladder and liver. Total Cholesterol is mildly elevated but Triglycerides are still above 400; LDL cholesterol cannot be measured when Triglycerides are this high. Before I suggest another medication to help lower these numbers, I would like to see what your liver looks like. Please let the staff know if the ultrasound test can be ordered.  Vitamin D level is now normal. ______

## 2012-08-22 ENCOUNTER — Telehealth: Payer: Self-pay

## 2012-08-22 DIAGNOSIS — R945 Abnormal results of liver function studies: Secondary | ICD-10-CM

## 2012-08-22 NOTE — Telephone Encounter (Signed)
Order placed, husband advised.

## 2012-08-22 NOTE — Telephone Encounter (Signed)
[name] husband called stating that they were looking in patients my chart and saw where it states to call back and schedule an ultrasound there is nothing in the referrals   Best number [phone]

## 2012-08-26 ENCOUNTER — Ambulatory Visit
Admission: RE | Admit: 2012-08-26 | Discharge: 2012-08-26 | Disposition: A | Discharge status: Home / Self Care | Payer: BC Managed Care – PPO | Source: Ambulatory Visit | Attending: Physician Assistant | Admitting: Physician Assistant

## 2012-08-26 DIAGNOSIS — R945 Abnormal results of liver function studies: Secondary | ICD-10-CM

## 2012-08-27 ENCOUNTER — Encounter: Payer: Self-pay | Admitting: Family Medicine

## 2012-08-27 ENCOUNTER — Telehealth: Payer: Self-pay

## 2012-08-27 NOTE — Telephone Encounter (Signed)
Pt is looking for ultrasound results   Best number is [phone]

## 2012-08-27 NOTE — Telephone Encounter (Signed)
lmom to cb. 

## 2012-08-27 NOTE — Telephone Encounter (Signed)
Pt husband notified of results of ultrasound

## 2012-08-28 ENCOUNTER — Other Ambulatory Visit: Payer: Self-pay | Admitting: Family Medicine

## 2012-08-28 MED ORDER — COLESEVELAM HCL 625 MG PO TABS: ORAL_TABLET | ORAL | Status: DC

## 2012-09-27 ENCOUNTER — Other Ambulatory Visit: Payer: Self-pay

## 2012-10-18 ENCOUNTER — Other Ambulatory Visit: Payer: Self-pay | Admitting: Family Medicine

## 2012-11-19 ENCOUNTER — Ambulatory Visit: Payer: BC Managed Care – PPO | Admitting: Family Medicine

## 2012-11-19 VITALS — BP 126/85 | HR 101 | Temp 98.2°F | Resp 20 | Ht 67.0 in | Wt 204.0 lb

## 2012-11-19 DIAGNOSIS — R5383 Other fatigue: Secondary | ICD-10-CM

## 2012-11-19 DIAGNOSIS — R5381 Other malaise: Secondary | ICD-10-CM

## 2012-11-19 DIAGNOSIS — R05 Cough: Secondary | ICD-10-CM

## 2012-11-19 LAB — POCT CBC
Hemoglobin: 14.1 g/dL (ref 12.2–16.2)
MCH, POC: 31 pg (ref 27–31.2)
MPV: 7.7 fL (ref 0–99.8)
POC MID %: 10 %M (ref 0–12)
RBC: 4.55 M/uL (ref 4.04–5.48)
WBC: 3.5 10*3/uL — AB (ref 4.6–10.2)

## 2012-11-19 LAB — POCT INFLUENZA A/B
Influenza A, POC: NEGATIVE
Influenza B, POC: NEGATIVE

## 2012-11-19 MED ORDER — BENZONATATE 100 MG PO CAPS: 100.0000 mg | ORAL_CAPSULE | Freq: Three times a day (TID) | ORAL | Status: DC | PRN

## 2012-11-19 MED ORDER — CEFDINIR 300 MG PO CAPS: 300.0000 mg | ORAL_CAPSULE | Freq: Two times a day (BID) | ORAL | Status: DC

## 2012-11-19 NOTE — Patient Instructions (Addendum)
Use the antibiotic as directed, and the tessalon perles for cough.  Let us know if you are not better in the next few days- Sooner if worse.

## 2012-11-19 NOTE — Progress Notes (Signed)
  Subjective:    Patient ID: Norma Bell, female    DOB: 11/18/1965, 46 y.o.   MRN: [id number]  HPI 47yo female here complaining of cough, sore throat, chest congestion, subjective fever and chills for 6 days. Productive cough with yellow mucus, will occasionally cough until she vomits. No known sick contacts. No flu shot this year. Has h/o allergic bronchitis, which feels the same as this. Has Meniere's disease, which she says contributes to a lot of these symptoms such as ear pain and headache.  She does not feel SOB, but does have congestion in her chest.  No CP.    Review of Systems  Constitutional: Positive for fever, chills, appetite change (decreased) and fatigue.  HENT: Positive for ear pain, congestion, sore throat, trouble swallowing (due to pain), postnasal drip and sinus pressure.   Respiratory: Positive for cough, chest tightness and shortness of breath.   Gastrointestinal: Positive for vomiting (from coughing). Negative for diarrhea and constipation.  Musculoskeletal: Positive for myalgias.  Neurological: Positive for headaches. Negative for dizziness.       Objective:   Physical Exam  Constitutional: She appears well-developed and well-nourished.  HENT:  Head: Normocephalic and atraumatic.  Right Ear: External ear normal.  Mouth/Throat: Oropharyngeal exudate present.  Eyes: Conjunctivae and EOM are normal. Pupils are equal, round, and reactive to light.  Neck: Normal range of motion. Neck supple.  Cardiovascular: Normal rate, regular rhythm and normal heart sounds.   Pulmonary/Chest: Effort normal and breath sounds normal.  Abdominal: Soft. Bowel sounds are normal. There is tenderness (mild, from coughing).  Lymphadenopathy:    She has no cervical adenopathy.  bilateral TM wnl Flu test negative.  Results for orders placed in visit on 11/19/12  POCT CBC      Result Value Range   WBC 3.5 (*) 4.6 - 10.2 K/uL   Lymph, poc 1.2  0.6 - 3.4   POC LYMPH PERCENT 35.7  10 -  50 %L   MID (cbc) 0.3  0 - 0.9   POC MID % 10.0  0 - 12 %M   POC Granulocyte 1.9 (*) 2 - 6.9   Granulocyte percent 54.3  37 - 80 %G   RBC 4.55  4.04 - 5.48 M/uL   Hemoglobin 14.1  12.2 - 16.2 g/dL   HCT, POC 11.9  14.7 - 47.9 %   MCV 94.4  80 - 97 fL   MCH, POC 31.0  27 - 31.2 pg   MCHC 32.8  31.8 - 35.4 g/dL   RDW, POC 82.9     Platelet Count, POC 300  142 - 424 K/uL   MPV 7.7  0 - 99.8 fL  POCT INFLUENZA A/B      Result Value Range   Influenza A, POC Negative     Influenza B, POC Negative         Assessment & Plan:   47 yo female with 6 day history of flu-like illness. 1) Cough, fever: White count normal, likely due to viral illness. Will start on omnicef due to duration of illness and lack of improvement. Told to follow up in 3-4 days if not improving, Sooner if worse.   Warned that abx can effect OCP efficacy

## 2012-12-02 ENCOUNTER — Encounter: Payer: BC Managed Care – PPO | Admitting: Family Medicine

## 2012-12-16 ENCOUNTER — Encounter: Payer: Self-pay | Admitting: Family Medicine

## 2012-12-16 ENCOUNTER — Ambulatory Visit (INDEPENDENT_AMBULATORY_CARE_PROVIDER_SITE_OTHER): Payer: BC Managed Care – PPO | Admitting: Family Medicine

## 2012-12-16 VITALS — BP 120/80 | HR 88 | Temp 98.0°F | Resp 16 | Ht 67.0 in | Wt 207.0 lb

## 2012-12-16 DIAGNOSIS — IMO0001 Reserved for inherently not codable concepts without codable children: Secondary | ICD-10-CM

## 2012-12-16 DIAGNOSIS — R7989 Other specified abnormal findings of blood chemistry: Secondary | ICD-10-CM

## 2012-12-16 DIAGNOSIS — E785 Hyperlipidemia, unspecified: Secondary | ICD-10-CM

## 2012-12-16 DIAGNOSIS — Z0000 Encounter for general adult medical examination without abnormal findings: Secondary | ICD-10-CM

## 2012-12-16 DIAGNOSIS — Z23 Encounter for immunization: Secondary | ICD-10-CM

## 2012-12-16 DIAGNOSIS — J45901 Unspecified asthma with (acute) exacerbation: Secondary | ICD-10-CM

## 2012-12-16 LAB — LIPID PANEL: Total CHOL/HDL Ratio: 4.8 Ratio

## 2012-12-16 LAB — COMPREHENSIVE METABOLIC PANEL
ALT: 37 U/L — ABNORMAL HIGH (ref 0–35)
AST: 66 U/L — ABNORMAL HIGH (ref 0–37)
Calcium: 10.1 mg/dL (ref 8.4–10.5)
Chloride: 97 mEq/L (ref 96–112)
Creat: 0.83 mg/dL (ref 0.50–1.10)
Total Bilirubin: 0.4 mg/dL (ref 0.3–1.2)

## 2012-12-16 MED ORDER — ALBUTEROL SULFATE HFA 108 (90 BASE) MCG/ACT IN AERS: 2.0000 | INHALATION_SPRAY | Freq: Four times a day (QID) | RESPIRATORY_TRACT | Status: DC | PRN

## 2012-12-16 MED ORDER — PRAVASTATIN SODIUM 40 MG PO TABS: ORAL_TABLET | ORAL | Status: DC

## 2012-12-16 NOTE — Patient Instructions (Addendum)
Bronchospasm A bronchospasm is when the tubes that carry air in and out of your lungs (bronchioles) become smaller. It is hard to breathe when this happens. A bronchospasm can be caused by:  Asthma.  Allergies.  Lung infection. HOME CARE   Do not  smoke. Avoid places that have secondhand smoke.  Dust your house often. Have your air ducts cleaned once or twice a year.  Find out what allergies may cause your bronchospasms.  Use your inhaler properly if you have one. Know when to use it.  Eat healthy foods and drink plenty of water.  Only take medicine as told by your doctor. GET HELP RIGHT AWAY IF:  You feel you cannot breathe or catch your breath.  You cannot stop coughing.  Your treatment is not helping you breathe better. MAKE SURE YOU:   Understand these instructions.  Will watch your condition.  Will get help right away if you are not doing well or get worse. Document Released: 05/27/2009 Document Revised: 10/22/2011 Document Reviewed: 05/27/2009 [facility] Patient Information 2013 [place], [place].  Get OTC Claritin (Loratidine is the generic) and take 1 tablet every  Day. Use the MDI (metered dose inhaler) as directed to help reduce your cough. If these medications do not help, a different medication may be precsribed. Get an [employer] and place it in your bedroom; it will help clean the air while you sleep so you do not awaken with congestion, cough, chest tightness and itchy red eyes.    Keeping You Healthy  Get These Tests 1. Blood Pressure- Have your blood pressure checked once a year by your health care provider.  Normal blood pressure is 120/80. 2. Weight- Have your body mass index (BMI) calculated to screen for obesity.  BMI is measure of body fat based on height and weight.  You can also calculate your own BMI at https://www.west-esparza.com/. 3. Cholesterol- Have your cholesterol checked every 5 years starting at age 66 then yearly starting at age  58. 4. Chlamydia, HIV, and other sexually transmitted diseases- Get screened every year until age 86, then within three months of each new sexual provider. 5. Pap Smear- Every 1-3 years; discuss with your health care provider. 6. Mammogram- Every year starting at age 68  Take these medicines  Calcium with Vitamin D-Your body needs 1200 mg of Calcium each day and [phone] IU of Vitamin D daily.  Your body can only absorb 500 mg of Calcium at a time so Calcium must be taken in 2 or 3 divided doses throughout the day.  Multivitamin with folic acid- Once daily if it is possible for you to become pregnant.  Get these Immunizations  Tetanus shot- Every 10 years. You received a one-time Tdap vaccination; your next Tetanus will be due in 2024.  Flu shot-Every year.  Take these steps 1. Do not smoke-Your healthcare provider can help you quit.  For tips on how to quit go to www.smokefree.gov or call 1-800 QUITNOW. 2. Be physically active- Exercise 5 days a week for at least 30 minutes.  If you are not already physically active, start slow and gradually work up to 30 minutes of moderate physical activity.  Examples of moderate activity include walking briskly, dancing, swimming, bicycling, etc. 3. Breast Cancer- A self breast exam every month is important for early detection of breast cancer.  For more information and instruction on self breast exams, ask your healthcare provider or [email or web address]. 4. Eat a healthy diet- Eat a variety of healthy foods  such as fruits, vegetables, whole grains, low fat milk, low fat cheeses, yogurt, lean meats, poultry and fish, beans, nuts, tofu, etc.  For more information go to www. Thenutritionsource.org 5. Drink alcohol in moderation- Limit alcohol intake to one drink or less per day. Never drink and drive. 6. Depression- Your emotional health is as important as your physical health.  If you're feeling down or losing interest in things  you normally enjoy please talk to your healthcare provider about being screened for depression. 7. Dental visit- Brush and floss your teeth twice daily; visit your dentist twice a year. 8. Eye doctor- Get an eye exam at least every 2 years. 9. Helmet use- Always wear a helmet when riding a bicycle, motorcycle, rollerblading or skateboarding. 10. Safe sex- If you may be exposed to sexually transmitted infections, use a condom. 11. Seat belts- Seat belts can save your live; always wear one. 12. Smoke/Carbon Monoxide detectors- These detectors need to be installed on the appropriate level of your home. Replace batteries at least once a year. 13. Skin cancer- When out in the sun please cover up and use sunscreen 15 SPF or higher. 14. Violence- If anyone is threatening or hurting you, please tell your healthcare provider.

## 2012-12-16 NOTE — Progress Notes (Signed)
Subjective:    Patient ID: Norma Bell, female    DOB: 1966/01/11, 47 y.o.   MRN: [id number]  HPI   This 47 y.o. Cauc female is here for CPE; annual GYN care is with Dr. Waynard Reeds. She  has hyperlipidemia treated with Welchol and Pravastatin. She is compliant with medications w/o  adverse effects. Husband states pt has made significant changes in nutrition. She also has  Meniere's Disease which is controlled with medications prescribed by her neurologist at  [facility]. OCPs prescribed by GYN help reduce perimenopause   and Meniere's symptoms.   Today pt also has mild back discomfort and leg pain related to  3 days of cooking in preparartion  for her son's recent graduation from [employer].   Patient Active Problem List   Diagnosis Date Noted  . 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 Obesity (BMI 30.0-34.9) 08/19/2012  . Hyperlipidemia 04/08/2012  . Meniere's disease 04/08/2012     PMHx, Surg Hx, Soc Hx and Fam Hx reviewed.     Review of Systems  Constitutional: Positive for diaphoresis.       Daytime hot flashes only.  HENT: Positive for tinnitus.   Eyes: Negative.   Respiratory: Positive for cough and chest tightness.        History of Childhood Asthma   Cardiovascular: Negative.   Gastrointestinal: Positive for vomiting.  Endocrine: Negative.   Genitourinary: Negative.   Musculoskeletal: Negative.   Skin: Negative.   Allergic/Immunologic: Negative.   Neurological: Positive for dizziness.       Pt has Meniere's Disease.  Psychiatric/Behavioral: Positive for sleep disturbance.       Objective:   Physical Exam  Nursing note and vitals reviewed. Constitutional: She is oriented to person, place, and time. Vital signs are normal. She appears well-developed and well-nourished. No distress.  HENT:  Head: Normocephalic and atraumatic.  Right Ear: External ear and ear canal normal. Tympanic membrane is scarred. Tympanic membrane is not  injected and not erythematous. No middle ear effusion. Decreased hearing is noted.  Left Ear: Hearing, external ear and ear canal normal. Tympanic membrane is scarred. Tympanic membrane is not injected and not erythematous.  No middle ear effusion.  Nose: Nose normal. No mucosal edema, rhinorrhea, nasal deformity or septal deviation.  Mouth/Throat: Uvula is midline, oropharynx is clear and moist and mucous membranes are normal. No oral lesions. Normal dentition. No dental caries.  Eyes: Conjunctivae, EOM and lids are normal. Pupils are equal, round, and reactive to light. No scleral icterus.  Pt wears corrective lenses; has annual vision evaluation.  Cardiovascular: Normal rate, regular rhythm, normal heart sounds and intact distal pulses.  Exam reveals no gallop and no friction rub.   No murmur heard. Pulmonary/Chest: Effort normal and breath sounds normal. No respiratory distress. She has no wheezes. She exhibits no tenderness.  Abdominal: Soft. Normal appearance. She exhibits no distension, no pulsatile midline mass and no mass. Bowel sounds are decreased. There is no hepatosplenomegaly. There is no tenderness. There is no guarding and no CVA tenderness. No hernia. Hernia confirmed negative in the ventral area.  Genitourinary:  Deferred.  Musculoskeletal: Normal range of motion. She exhibits tenderness. She exhibits no edema.  Paravertebral muscles tender w/ palpation. No deformities noted.  Neurological: She is alert and oriented to person, place, and time. She has normal reflexes. No cranial nerve deficit. She exhibits normal muscle tone. Coordination normal.  DTRs are brisk but symmetric. Very mild dysarthria noted; impaired hearing.  Skin: Skin is warm and dry. No rash noted. No erythema.  Psychiatric: She has a normal mood and affect. Her behavior is normal. Judgment and thought content normal.         Assessment & Plan:  Asthma exacerbation, allergic- get  OTC antihistamine (i.e.  Claritin) and use Albuterol MDI daily as instructed. Get an air purifier for bedroom and other rooms to reduce allergens in the home.   Other and unspecified hyperlipidemia - Plan: Lipid panel; continue current medications and nutrition adjusutments.  Elevated LFTs - Plan: Comprehensive metabolic panel  Need for prophylactic vaccination with combined diphtheria-tetanus-pertussis (DTP) vaccine - Plan: Tdap vaccine greater than or equal to 7yo IM   Meds ordered this encounter  Medications  . pravastatin (PRAVACHOL) 40 MG tablet    Sig: TAKE 1 TABLET ONCE DAILY.    Dispense:  30 tablet    Refill:  5  . albuterol (PROVENTIL HFA;VENTOLIN HFA) 108 (90 BASE) MCG/ACT inhaler    Sig: Inhale 2 puffs into the lungs every 6 (six) hours as needed for wheezing.    Dispense:  1 Inhaler    Refill:  2

## 2012-12-19 ENCOUNTER — Encounter: Payer: Self-pay | Admitting: Family Medicine

## 2013-06-18 ENCOUNTER — Other Ambulatory Visit: Payer: Self-pay

## 2013-06-20 ENCOUNTER — Other Ambulatory Visit: Payer: Self-pay | Admitting: Family Medicine

## 2013-06-23 ENCOUNTER — Ambulatory Visit: Payer: BC Managed Care – PPO | Admitting: Family Medicine

## 2013-07-27 ENCOUNTER — Other Ambulatory Visit: Payer: Self-pay | Admitting: Physician Assistant

## 2014-12-05 ENCOUNTER — Emergency Department (HOSPITAL_COMMUNITY): Payer: 59

## 2014-12-05 ENCOUNTER — Encounter (HOSPITAL_COMMUNITY): Payer: Self-pay | Admitting: *Deleted

## 2014-12-05 ENCOUNTER — Emergency Department (HOSPITAL_COMMUNITY)
Admission: EM | Admit: 2014-12-05 | Discharge: 2014-12-06 | Disposition: A | Discharge status: Home / Self Care | Payer: 59 | Attending: Emergency Medicine | Admitting: Emergency Medicine

## 2014-12-05 DIAGNOSIS — W1839XA Other fall on same level, initial encounter: Secondary | ICD-10-CM | POA: Diagnosis not present

## 2014-12-05 DIAGNOSIS — Y929 Unspecified place or not applicable: Secondary | ICD-10-CM | POA: Diagnosis not present

## 2014-12-05 DIAGNOSIS — Z79899 Other long term (current) drug therapy: Secondary | ICD-10-CM | POA: Diagnosis not present

## 2014-12-05 DIAGNOSIS — R000 Tachycardia, unspecified: Secondary | ICD-10-CM | POA: Diagnosis not present

## 2014-12-05 DIAGNOSIS — Y939 Activity, unspecified: Secondary | ICD-10-CM | POA: Insufficient documentation

## 2014-12-05 DIAGNOSIS — Y999 Unspecified external cause status: Secondary | ICD-10-CM | POA: Diagnosis not present

## 2014-12-05 DIAGNOSIS — S82401A Unspecified fracture of shaft of right fibula, initial encounter for closed fracture: Secondary | ICD-10-CM | POA: Insufficient documentation

## 2014-12-05 DIAGNOSIS — Z8669 Personal history of other diseases of the nervous system and sense organs: Secondary | ICD-10-CM | POA: Insufficient documentation

## 2014-12-05 DIAGNOSIS — S99911A Unspecified injury of right ankle, initial encounter: Secondary | ICD-10-CM | POA: Diagnosis present

## 2014-12-05 DIAGNOSIS — M25571 Pain in right ankle and joints of right foot: Secondary | ICD-10-CM

## 2014-12-05 HISTORY — DX: Meniere's disease, unspecified ear: H81.09

## 2014-12-05 NOTE — ED Provider Notes (Signed)
CSN: [id number]     Arrival date & time 12/05/14  2310 History  This chart was scribed for non-physician practitioner, Allen Derry, PA-C working with Loren Racer, MD by Greggory Stallion, ED scribe. This patient was seen in room TR10C/TR10C and the patient's care was started at 11:53 PM.    Chief Complaint  Patient presents with  . Ankle Injury   Patient is a 49 y.o. female presenting with lower extremity injury. The history is provided by the patient. No language interpreter was used.  Ankle Injury This is a new problem. The current episode started 1 to 2 hours ago. The problem occurs rarely. The problem has not changed since onset.Pertinent negatives include no chest pain, no abdominal pain and no shortness of breath. The symptoms are aggravated by walking (bearing weight, movement). Nothing relieves the symptoms. She has tried a cold compress for the symptoms. The treatment provided no relief.    HPI Comments: Norma Bell is a 49 y.o. female with a PMHx of meniere's disease and cochlear implants, who presents to the Emergency Department complaining of right ankle injury that occurred about 2 hours ago. She fell and twisted her ankle. Pt denies hitting her head or LOC. She reports sudden onset pain with associated swelling. Pt rates pain 9/10 and describes it as constant and sore, located to the lateral malleolus. Movements and bearing weight worsen pain and cause it to radiate into her lower leg and toes. She has iced with no relief but not yet taken any medications. Pt denies fever, chills, chest pain, SOB, knee pain, hip pain, back pain, bruising, redness, warmth, numbness or tingling, weakness, abd pain, or n/v.   Past Medical History  Diagnosis Date  . Allergy   . Meniere disease    Past Surgical History  Procedure Laterality Date  . Appendectomy    . Partial hip arthroplasty Left   . Cochlear implant    . Stunts in both ears    . Tubal ligation    . Joint replacement     . Cochlear implant left ear     Family History  Problem Relation Age of Onset  . Hypertension Mother   . Hypertension Father    History  Substance Use Topics  . Smoking status: Never Smoker   . Smokeless tobacco: Not on file  . Alcohol Use: No   OB History    No data available     Review of Systems  Constitutional: Negative for fever and chills.  HENT: Negative for facial swelling (no head injury).   Respiratory: Negative for shortness of breath.   Cardiovascular: Negative for chest pain.  Gastrointestinal: Negative for nausea, vomiting and abdominal pain.  Musculoskeletal: Positive for joint swelling and arthralgias. Negative for back pain and neck pain.  Skin: Negative for color change and wound.  Allergic/Immunologic: Negative for immunocompromised state.  Neurological: Negative for weakness and numbness.  Psychiatric/Behavioral: Negative for confusion.  10 systems reviewed and are negative for acute changes except as noted in the HPI.  Allergies  Prilosec  Home Medications   Prior to Admission medications   Medication Sig Start Date End Date Taking? Authorizing Provider  albuterol (PROVENTIL HFA;VENTOLIN HFA) 108 (90 BASE) MCG/ACT inhaler Inhale 2 puffs into the lungs every 6 (six) hours as needed for wheezing. 12/16/12   Maurice March, MD  amitriptyline (ELAVIL) 75 MG tablet Take 75 mg by mouth at bedtime.    Historical Provider, MD  benzonatate (TESSALON) 100 MG capsule Take 1  capsule (100 mg total) by mouth 3 (three) times daily as needed for cough. 11/19/12   Gwenlyn Found Copland, MD  colesevelam (WELCHOL) 625 MG tablet Take 3 tablets by mouth once a day. 08/28/12   Maurice March, MD  norethindrone-ethinyl estradiol-iron (MICROGESTIN FE,GILDESS FE,LOESTRIN FE) 1.5-30 MG-MCG tablet Take 1 tablet by mouth daily.    Historical Provider, MD  pravastatin (PRAVACHOL) 40 MG tablet Take 1 tablet (40 mg total) by mouth daily. PATIENT NEEDS OFFICE VISIT FOR ADDITIONAL  REFILLS 07/27/13   Maurice March, MD  triamterene-hydrochlorothiazide [facility]) 75-50 MG per tablet Take 1 tablet by mouth daily.    Historical Provider, MD  ZOLPIDEM TARTRATE PO Take 5 mg by mouth.    Historical Provider, MD   BP 136/78 mmHg  Pulse 108  Temp(Src) 98.8 F (37.1 C) (Oral)  Resp 18  SpO2 98%   Physical Exam  Constitutional: She is oriented to person, place, and time. She appears well-developed and well-nourished.  Non-toxic appearance. No distress.  Afebrile, nontoxic, NAD unless ankle is manipulated at which time she appears uncomfortable, mildly tachycardic likely due to pain  HENT:  Head: Normocephalic and atraumatic.  Mouth/Throat: Mucous membranes are normal.  Eyes: Conjunctivae and EOM are normal. Right eye exhibits no discharge. Left eye exhibits no discharge.  Neck: Normal range of motion. Neck supple.  Cardiovascular: Intact distal pulses.  Tachycardia present.   Mildly tachycardic, likely due to pain.  Pulmonary/Chest: Effort normal. No respiratory distress.  Abdominal: Normal appearance. She exhibits no distension.  Musculoskeletal:       Right ankle: She exhibits decreased range of motion (due to pain) and swelling. She exhibits no deformity and normal pulse. Tenderness. Lateral malleolus tenderness found. Achilles tendon normal.       Feet:  Right ankle with limited ROM due to pain, lateral malleolar TTP with swelling, no bruising or deformity, no erythema or warmth, wiggles all digits, distal pulses intact. Strength with dorsiflexion and plantar flexion mildly diminished due to pain, but otherwise intact. Sensation grossly intact. Achilles intact. No knee, forefoot, or calf TTP. Skin intact.    Neurological: She is alert and oriented to person, place, and time. She has normal strength. No sensory deficit.  Skin: Skin is warm, dry and intact. No rash noted.  Psychiatric: She has a normal mood and affect. Her behavior is normal.  Nursing note and  vitals reviewed.   ED Course  Procedures (including critical care time)  DIAGNOSTIC STUDIES: Oxygen Saturation is 99% on RA, normal by my interpretation.    COORDINATION OF CARE: 11:57 PM-Discussed treatment plan which includes imaging with pt at bedside and pt agreed to plan.   Labs Review Labs Reviewed - No data to display  Imaging Review Dg Ankle Complete Right  12/06/2014   CLINICAL DATA:  49 year old female with right lateral ankle pain and swelling after falling in her room at home a  EXAM: RIGHT ANKLE - COMPLETE 3+ VIEW  COMPARISON:  None.  FINDINGS: Acute nondisplaced fracture through the distal fibula just below the level of the tibial plafond. There is associated overlying soft tissue swelling. The remainder the visualized bones and joints are intact and unremarkable. Normal bony mineralization. No lytic or blastic osseous lesion.  IMPRESSION: Nondisplaced distal fibular fracture.   Electronically Signed   By: Malachy Moan M.D.   On: 12/06/2014 00:51     EKG Interpretation None      MDM   Final diagnoses:  Closed fibular fracture, right, initial  encounter  Ankle pain, right    49 y.o. female here with R ankle pain after twisting it. Neurovascularly intact with soft compartments. TTP over lateral malleolus with swelling. will obtain imaging then reassess. Pt Initially declined pain meds but then after discussion regarding possibly needing to have splint placed if she fractured anything, she agreed. Pt mildly tachycardic likely due to pain. Will reassess shortly.   1:03 AM Xray showing nondisplaced distal fibular fx. Pain improving after pain meds. Will place in splint and give crutches, neurovascularly intact after splint placement. Discussed RICE therapy. Will give pain control to go home with. Will have her f/up with ortho in 5-7 days for ongoing management. I explained the diagnosis and have given explicit precautions to return to the ER including for any other new  or worsening symptoms. The patient understands and accepts the medical plan as it's been dictated and I have answered their questions. Discharge instructions concerning home care and prescriptions have been given. The patient is STABLE and is discharged to home in good condition.   I personally performed the services described in this documentation, which was scribed in my presence. The recorded information has been reviewed and is accurate.  BP 136/78 mmHg  Pulse 108  Temp(Src) 98.8 F (37.1 C) (Oral)  Resp 18  SpO2 98%  Meds ordered this encounter  Medications  . HYDROcodone-acetaminophen (NORCO/VICODIN) 5-325 MG per tablet 1 tablet    Sig:   . naproxen (NAPROSYN) 500 MG tablet    Sig: Take 1 tablet (500 mg total) by mouth 2 (two) times daily as needed for mild pain, moderate pain or headache (TAKE WITH MEALS.).    Dispense:  20 tablet    Refill:  0    Order Specific Question:  Supervising Provider    Answer:  MILLER, BRIAN [[id number]]  . HYDROcodone-acetaminophen (NORCO) 5-325 MG per tablet    Sig: Take 1 tablet by mouth every 6 (six) hours as needed for severe pain.    Dispense:  20 tablet    Refill:  0    Order Specific Question:  Supervising Provider    Answer:  Eber HongMILLER, BRIAN [[id number]]      Graycie Halley Camprubi-Soms, PA-C 12/06/14 0133  Loren Raceravid Yelverton, MD 12/06/14 [id number]

## 2014-12-05 NOTE — ED Notes (Signed)
Pt states that she fell and twisted her ankle. C/o pain to site. States that she iced it.

## 2014-12-06 MED ORDER — HYDROCODONE-ACETAMINOPHEN 5-325 MG PO TABS
1.0000 | ORAL_TABLET | Freq: Once | ORAL | Status: AC
Start: 2014-12-06 — End: 2014-12-06
  Administered 2014-12-06: 1 via ORAL
  Filled 2014-12-06: qty 1

## 2014-12-06 MED ORDER — NAPROXEN 500 MG PO TABS: 500.0000 mg | ORAL_TABLET | Freq: Two times a day (BID) | ORAL | Status: DC | PRN

## 2014-12-06 MED ORDER — HYDROCODONE-ACETAMINOPHEN 5-325 MG PO TABS: 1.0000 | ORAL_TABLET | Freq: Four times a day (QID) | ORAL | Status: DC | PRN

## 2014-12-06 NOTE — Discharge Instructions (Signed)
Wear ankle splint at all times until you see the orthopedist. Use crutches for all weight bearing activities. Ice and elevate ankle throughout the day. Alternate between naprosyn and norco for pain relief. Do not drive or operate machinery with pain medication use. Call orthopedic follow up tomorrow to schedule followup appointment for recheck in 5-7 days. Return to the ER for changes or worsening symptoms.    Fibular Fracture, Ankle, Adult, Treated With or Without Immobilization A fibular fracture at your ankle is a break (fracture) bone in the smallest of the two bones in your lower leg, located on the outside of your leg (fibula) close to the area at your ankle joint. CAUSES  Rolling your ankle.  Twisting your ankle.  Extreme flexing or extending of your foot.  Severe force on your ankle as when falling from a distance. RISK FACTORS  Jumping activities.  Participation in sports.  Osteoporosis.  Advanced age.  Previous ankle injuries. SIGNS AND SYMPTOMS  Pain.  Swelling.  Inability to put weight on injured ankle.  Bruising.  Bone deformities at site of injury. DIAGNOSIS  This fracture is diagnosed with the help of an X-ray exam. TREATMENT  If the fractured bone did not move out of place it usually will heal without problems and does casting or splinting. If immobilization is needed for comfort or the fractured bone moved out of place and will not heal properly with immobilization, a cast or splint will be used. HOME CARE INSTRUCTIONS   Apply ice to the area of injury:  Put ice in a plastic bag.  Place a towel between your skin and the bag.  Leave the ice on for 20 minutes, 2-3 times a day.  Use crutches as directed. Resume walking without crutches as directed by your health care provider.  Only take over-the-counter or prescription medicines for pain, discomfort, or fever as directed by your health care provider.  If you have a removable splint or boot, do not  remove the boot unless directed by your health care provider. SEEK MEDICAL CARE IF:   You have continued pain or more swelling  The medications do not control the pain. SEEK IMMEDIATE MEDICAL CARE IF:  You develop severe pain in the leg or foot.  Your skin or nails below the injury turn blue or grey or feel cold or numb. MAKE SURE YOU:   Understand these instructions.  Will watch your condition.  Will get help right away if you are not doing well or get worse. Document Released: 07/30/2005 Document Revised: 05/20/2013 Document Reviewed: 03/11/2013 [facility] Patient Information 2015 [place], [place]. This information is not intended to replace advice given to you by your health care provider. Make sure you discuss any questions you have with your health care provider.  Cast or Splint Care Casts and splints support injured limbs and keep bones from moving while they heal.  HOME CARE  Keep the cast or splint uncovered during the drying period.  A plaster cast can take 24 to 48 hours to dry.  A fiberglass cast will dry in less than 1 hour.  Do not rest the cast on anything harder than a pillow for 24 hours.  Do not put weight on your injured limb. Do not put pressure on the cast. Wait for your doctor's approval.  Keep the cast or splint dry.  Cover the cast or splint with a plastic bag during baths or wet weather.  If you have a cast over your chest and belly (trunk),  take sponge baths until the cast is taken off.  If your cast gets wet, dry it with a towel or blow dryer. Use the cool setting on the blow dryer.  Keep your cast or splint clean. Wash a dirty cast with a damp cloth.  Do not put any objects under your cast or splint.  Do not scratch the skin under the cast with an object. If itching is a problem, use a blow dryer on a cool setting over the itchy area.  Do not trim or cut your cast.  Do not take out the padding from inside your cast.  Exercise your joints  near the cast as told by your doctor.  Raise (elevate) your injured limb on 1 or 2 pillows for the first 1 to 3 days. GET HELP IF:  Your cast or splint cracks.  Your cast or splint is too tight or too loose.  You itch badly under the cast.  Your cast gets wet or has a soft spot.  You have a bad smell coming from the cast.  You get an object stuck under the cast.  Your skin around the cast becomes red or sore.  You have new or more pain after the cast is put on. GET HELP RIGHT AWAY IF:  You have fluid leaking through the cast.  You cannot move your fingers or toes.  Your fingers or toes turn blue or white or are cool, painful, or puffy (swollen).  You have tingling or lose feeling (numbness) around the injured area.  You have bad pain or pressure under the cast.  You have trouble breathing or have shortness of breath.  You have chest pain. Document Released: 11/29/2010 Document Revised: 04/01/2013 Document Reviewed: 02/05/2013 [facility] Patient Information 2015 [place], [place]. This information is not intended to replace advice given to you by your health care provider. Make sure you discuss any questions you have with your health care provider.  Cryotherapy Cryotherapy is when you put ice on your injury. Ice helps lessen pain and puffiness (swelling) after an injury. Ice works the best when you start using it in the first 24 to 48 hours after an injury. HOME CARE  Put a dry or damp towel between the ice pack and your skin.  You may press gently on the ice pack.  Leave the ice on for no more than 10 to 20 minutes at a time.  Check your skin after 5 minutes to make sure your skin is okay.  Rest at least 20 minutes between ice pack uses.  Stop using ice when your skin loses feeling (numbness).  Do not use ice on someone who cannot tell you when it hurts. This includes small children and people with memory problems (dementia). GET HELP RIGHT AWAY IF:  You have  white spots on your skin.  Your skin turns blue or pale.  Your skin feels waxy or hard.  Your puffiness gets worse. MAKE SURE YOU:   Understand these instructions.  Will watch your condition.  Will get help right away if you are not doing well or get worse. Document Released: 01/16/2008 Document Revised: 10/22/2011 Document Reviewed: 03/22/2011 [facility] Patient Information 2015 [place], [place]. This information is not intended to replace advice given to you by your health care provider. Make sure you discuss any questions you have with your health care provider.

## 2015-02-01 ENCOUNTER — Encounter (HOSPITAL_COMMUNITY): Admission: EM | Disposition: A | Discharge status: Home / Self Care | Payer: 59 | Attending: Interventional Cardiology

## 2015-02-01 ENCOUNTER — Encounter (HOSPITAL_COMMUNITY): Payer: Self-pay | Admitting: Physician Assistant

## 2015-02-01 ENCOUNTER — Ambulatory Visit (HOSPITAL_COMMUNITY): Payer: Medicare Other

## 2015-02-01 ENCOUNTER — Inpatient Hospital Stay (HOSPITAL_COMMUNITY)
Admission: EM | Admit: 2015-02-01 | Discharge: 2015-02-04 | DRG: 246 | Disposition: A | Discharge status: Home / Self Care | Payer: Medicare Other | Source: Intra-hospital | Attending: Interventional Cardiology | Admitting: Interventional Cardiology

## 2015-02-01 DIAGNOSIS — Z8249 Family history of ischemic heart disease and other diseases of the circulatory system: Secondary | ICD-10-CM | POA: Diagnosis not present

## 2015-02-01 DIAGNOSIS — I5021 Acute systolic (congestive) heart failure: Secondary | ICD-10-CM | POA: Diagnosis present

## 2015-02-01 DIAGNOSIS — E785 Hyperlipidemia, unspecified: Secondary | ICD-10-CM | POA: Diagnosis present

## 2015-02-01 DIAGNOSIS — I959 Hypotension, unspecified: Secondary | ICD-10-CM | POA: Diagnosis present

## 2015-02-01 DIAGNOSIS — R079 Chest pain, unspecified: Secondary | ICD-10-CM

## 2015-02-01 DIAGNOSIS — Z9621 Cochlear implant status: Secondary | ICD-10-CM

## 2015-02-01 DIAGNOSIS — H9193 Unspecified hearing loss, bilateral: Secondary | ICD-10-CM | POA: Diagnosis present

## 2015-02-01 DIAGNOSIS — I5020 Unspecified systolic (congestive) heart failure: Secondary | ICD-10-CM

## 2015-02-01 DIAGNOSIS — I472 Ventricular tachycardia: Secondary | ICD-10-CM | POA: Diagnosis not present

## 2015-02-01 DIAGNOSIS — R570 Cardiogenic shock: Secondary | ICD-10-CM

## 2015-02-01 DIAGNOSIS — Z888 Allergy status to other drugs, medicaments and biological substances status: Secondary | ICD-10-CM | POA: Diagnosis not present

## 2015-02-01 DIAGNOSIS — H8109 Meniere's disease, unspecified ear: Secondary | ICD-10-CM | POA: Diagnosis present

## 2015-02-01 DIAGNOSIS — R072 Precordial pain: Secondary | ICD-10-CM | POA: Diagnosis present

## 2015-02-01 DIAGNOSIS — E876 Hypokalemia: Secondary | ICD-10-CM | POA: Diagnosis not present

## 2015-02-01 DIAGNOSIS — Z96642 Presence of left artificial hip joint: Secondary | ICD-10-CM | POA: Diagnosis present

## 2015-02-01 DIAGNOSIS — I213 ST elevation (STEMI) myocardial infarction of unspecified site: Secondary | ICD-10-CM | POA: Diagnosis present

## 2015-02-01 DIAGNOSIS — I2510 Atherosclerotic heart disease of native coronary artery without angina pectoris: Secondary | ICD-10-CM

## 2015-02-01 DIAGNOSIS — I2102 ST elevation (STEMI) myocardial infarction involving left anterior descending coronary artery: Secondary | ICD-10-CM | POA: Diagnosis not present

## 2015-02-01 DIAGNOSIS — I5041 Acute combined systolic (congestive) and diastolic (congestive) heart failure: Secondary | ICD-10-CM | POA: Diagnosis not present

## 2015-02-01 DIAGNOSIS — Z793 Long term (current) use of hormonal contraceptives: Secondary | ICD-10-CM | POA: Diagnosis not present

## 2015-02-01 HISTORY — DX: Hyperlipidemia, unspecified: E78.5

## 2015-02-01 HISTORY — PX: CARDIAC CATHETERIZATION: SHX172

## 2015-02-01 HISTORY — DX: Unspecified hearing loss, unspecified ear: H91.90

## 2015-02-01 LAB — CBC WITH DIFFERENTIAL/PLATELET
Basophils Absolute: 0 10*3/uL (ref 0.0–0.1)
Basophils Relative: 0 % (ref 0–1)
EOS PCT: 0 % (ref 0–5)
Eosinophils Absolute: 0 10*3/uL (ref 0.0–0.7)
HCT: 37.9 % (ref 36.0–46.0)
Hemoglobin: 13.2 g/dL (ref 12.0–15.0)
LYMPHS PCT: 10 % — AB (ref 12–46)
Lymphs Abs: 0.9 10*3/uL (ref 0.7–4.0)
MCH: 31.5 pg (ref 26.0–34.0)
MCHC: 34.8 g/dL (ref 30.0–36.0)
MCV: 90.5 fL (ref 78.0–100.0)
MONO ABS: 0.3 10*3/uL (ref 0.1–1.0)
Monocytes Relative: 4 % (ref 3–12)
Neutro Abs: 7.6 10*3/uL (ref 1.7–7.7)
Neutrophils Relative %: 86 % — ABNORMAL HIGH (ref 43–77)
PLATELETS: 408 10*3/uL — AB (ref 150–400)
RBC: 4.19 MIL/uL (ref 3.87–5.11)
RDW: 13.1 % (ref 11.5–15.5)
WBC: 8.9 10*3/uL (ref 4.0–10.5)

## 2015-02-01 LAB — POCT ACTIVATED CLOTTING TIME: ACTIVATED CLOTTING TIME: 448 s

## 2015-02-01 LAB — POCT I-STAT, CHEM 8
BUN: 16 mg/dL (ref 6–20)
CREATININE: 0.9 mg/dL (ref 0.44–1.00)
Calcium, Ion: 1.02 mmol/L — ABNORMAL LOW (ref 1.12–1.23)
Chloride: 101 mmol/L (ref 101–111)
GLUCOSE: 176 mg/dL — AB (ref 65–99)
HCT: 35 % — ABNORMAL LOW (ref 36.0–46.0)
Hemoglobin: 11.9 g/dL — ABNORMAL LOW (ref 12.0–15.0)
POTASSIUM: 2.5 mmol/L — AB (ref 3.5–5.1)
Sodium: 134 mmol/L — ABNORMAL LOW (ref 135–145)
TCO2: 17 mmol/L (ref 0–100)

## 2015-02-01 LAB — COMPREHENSIVE METABOLIC PANEL
ALBUMIN: 3.1 g/dL — AB (ref 3.5–5.0)
ALT: 30 U/L (ref 14–54)
ANION GAP: 15 (ref 5–15)
AST: 98 U/L — ABNORMAL HIGH (ref 15–41)
Alkaline Phosphatase: 50 U/L (ref 38–126)
BUN: 16 mg/dL (ref 6–20)
CHLORIDE: 101 mmol/L (ref 101–111)
CO2: 22 mmol/L (ref 22–32)
CREATININE: 1.09 mg/dL — AB (ref 0.44–1.00)
Calcium: 8.3 mg/dL — ABNORMAL LOW (ref 8.9–10.3)
GFR calc Af Amer: >60 mL/min (ref >60)
GFR calc non Af Amer: 59 mL/min — ABNORMAL LOW (ref >60)
Glucose, Bld: 107 mg/dL — ABNORMAL HIGH (ref 65–99)
Potassium: 3.7 mmol/L (ref 3.5–5.1)
Sodium: 138 mmol/L (ref 135–145)
TOTAL PROTEIN: 6.8 g/dL (ref 6.5–8.1)
Total Bilirubin: 0.8 mg/dL (ref 0.3–1.2)

## 2015-02-01 LAB — TROPONIN I
Troponin I: 15.2 ng/mL (ref <0.031)
Troponin I: 33.4 ng/mL (ref <0.031)

## 2015-02-01 LAB — MAGNESIUM: Magnesium: 1.4 mg/dL — ABNORMAL LOW (ref 1.7–2.4)

## 2015-02-01 LAB — TSH: TSH: 2.332 u[IU]/mL (ref 0.350–4.500)

## 2015-02-01 LAB — MRSA PCR SCREENING: MRSA by PCR: NEGATIVE

## 2015-02-01 SURGERY — LEFT HEART CATH AND CORONARY ANGIOGRAPHY

## 2015-02-01 MED ORDER — POTASSIUM CHLORIDE 10 MEQ/100ML IV SOLN
INTRAVENOUS | Status: AC
  Filled 2015-02-01: qty 100

## 2015-02-01 MED ORDER — BIVALIRUDIN BOLUS VIA INFUSION - CUPID
INTRAVENOUS | Status: DC | PRN
  Administered 2015-02-01: 70.5 mg via INTRAVENOUS

## 2015-02-01 MED ORDER — VERAPAMIL HCL 2.5 MG/ML IV SOLN
INTRAVENOUS | Status: DC | PRN
  Administered 2015-02-01 (×2): 200 ug via INTRACORONARY

## 2015-02-01 MED ORDER — CANGRELOR BOLUS VIA INFUSION
INTRAVENOUS | Status: DC | PRN
  Administered 2015-02-01: 2820 ug via INTRAVENOUS

## 2015-02-01 MED ORDER — FENTANYL CITRATE (PF) 100 MCG/2ML IJ SOLN
INTRAMUSCULAR | Status: DC | PRN
  Administered 2015-02-01 (×2): 25 ug via INTRAVENOUS

## 2015-02-01 MED ORDER — SODIUM CHLORIDE 0.9 % IV SOLN
250.0000 mg | INTRAVENOUS | Status: DC | PRN
  Administered 2015-02-01: 1.75 mg/kg/h via INTRAVENOUS

## 2015-02-01 MED ORDER — HEPARIN (PORCINE) IN NACL 2-0.9 UNIT/ML-% IJ SOLN
INTRAMUSCULAR | Status: AC
  Filled 2015-02-01: qty 1000

## 2015-02-01 MED ORDER — ONDANSETRON HCL 4 MG/2ML IJ SOLN
4.0000 mg | Freq: Four times a day (QID) | INTRAMUSCULAR | Status: DC | PRN
  Administered 2015-02-01: 4 mg via INTRAVENOUS

## 2015-02-01 MED ORDER — AMIODARONE HCL 150 MG/3ML IV SOLN
INTRAVENOUS | Status: AC
  Filled 2015-02-01: qty 3

## 2015-02-01 MED ORDER — BIVALIRUDIN 250 MG IV SOLR
INTRAVENOUS | Status: AC
  Filled 2015-02-01: qty 250

## 2015-02-01 MED ORDER — NITROGLYCERIN 0.4 MG SL SUBL
0.4000 mg | SUBLINGUAL_TABLET | SUBLINGUAL | Status: DC | PRN
Start: 2015-02-01 — End: 2015-02-04
  Administered 2015-02-01 (×2): 0.4 mg via SUBLINGUAL
  Filled 2015-02-01 (×2): qty 1

## 2015-02-01 MED ORDER — AMIODARONE HCL 150 MG/3ML IV SOLN
INTRAVENOUS | Status: DC | PRN
  Administered 2015-02-01: 300 mg via INTRAVENOUS

## 2015-02-01 MED ORDER — ALBUTEROL SULFATE (2.5 MG/3ML) 0.083% IN NEBU
2.0000 mL | INHALATION_SOLUTION | Freq: Four times a day (QID) | RESPIRATORY_TRACT | Status: DC | PRN
  Filled 2015-02-01: qty 3

## 2015-02-01 MED ORDER — HYDROCODONE-ACETAMINOPHEN 5-325 MG PO TABS: 1.0000 | ORAL_TABLET | Freq: Four times a day (QID) | ORAL | Status: DC | PRN

## 2015-02-01 MED ORDER — ALPRAZOLAM 0.25 MG PO TABS: 0.2500 mg | ORAL_TABLET | Freq: Two times a day (BID) | ORAL | Status: DC | PRN

## 2015-02-01 MED ORDER — MIDAZOLAM HCL 2 MG/2ML IJ SOLN
INTRAMUSCULAR | Status: AC
  Filled 2015-02-01: qty 2

## 2015-02-01 MED ORDER — TICAGRELOR 90 MG PO TABS
ORAL_TABLET | ORAL | Status: DC | PRN
  Administered 2015-02-01: 180 mg via ORAL

## 2015-02-01 MED ORDER — BENZONATATE 100 MG PO CAPS
100.0000 mg | ORAL_CAPSULE | Freq: Three times a day (TID) | ORAL | Status: DC | PRN
Start: 2015-02-01 — End: 2015-02-04
  Filled 2015-02-01: qty 1

## 2015-02-01 MED ORDER — POTASSIUM CHLORIDE 10 MEQ/100ML IV SOLN
INTRAVENOUS | Status: DC | PRN
Start: 2015-02-01 — End: 2015-02-01
  Administered 2015-02-01: 10 meq via INTRAVENOUS

## 2015-02-01 MED ORDER — ZOLPIDEM TARTRATE 5 MG PO TABS
5.0000 mg | ORAL_TABLET | Freq: Every evening | ORAL | Status: DC | PRN
Start: 2015-02-01 — End: 2015-02-04

## 2015-02-01 MED ORDER — ENOXAPARIN SODIUM 30 MG/0.3ML ~~LOC~~ SOLN
30.0000 mg | SUBCUTANEOUS | Status: DC
  Administered 2015-02-02 – 2015-02-04 (×3): 30 mg via SUBCUTANEOUS
  Filled 2015-02-01 (×4): qty 0.3

## 2015-02-01 MED ORDER — ACETAMINOPHEN 325 MG PO TABS: 650.0000 mg | ORAL_TABLET | ORAL | Status: DC | PRN

## 2015-02-01 MED ORDER — MAGNESIUM SULFATE 4 GM/100ML IV SOLN
4.0000 g | Freq: Once | INTRAVENOUS | Status: AC
  Administered 2015-02-01: 4 g via INTRAVENOUS
  Filled 2015-02-01: qty 100

## 2015-02-01 MED ORDER — SODIUM CHLORIDE 0.9 % IJ SOLN
3.0000 mL | Freq: Two times a day (BID) | INTRAMUSCULAR | Status: DC
  Administered 2015-02-01 – 2015-02-02 (×2): 3 mL via INTRAVENOUS

## 2015-02-01 MED ORDER — AMITRIPTYLINE HCL 75 MG PO TABS
75.0000 mg | ORAL_TABLET | Freq: Every day | ORAL | Status: DC
  Administered 2015-02-01 – 2015-02-03 (×3): 75 mg via ORAL
  Filled 2015-02-01 (×4): qty 1

## 2015-02-01 MED ORDER — TICAGRELOR 90 MG PO TABS
ORAL_TABLET | ORAL | Status: AC
  Filled 2015-02-01: qty 2

## 2015-02-01 MED ORDER — SODIUM CHLORIDE 0.9 % IV SOLN: 250.0000 mL | INTRAVENOUS | Status: DC | PRN

## 2015-02-01 MED ORDER — VERAPAMIL HCL 2.5 MG/ML IV SOLN
INTRAVENOUS | Status: AC
  Filled 2015-02-01: qty 2

## 2015-02-01 MED ORDER — ONDANSETRON HCL 4 MG/2ML IJ SOLN: 4.0000 mg | Freq: Four times a day (QID) | INTRAMUSCULAR | Status: DC | PRN

## 2015-02-01 MED ORDER — SODIUM CHLORIDE 0.9 % IV SOLN
INTRAVENOUS | Status: AC
  Administered 2015-02-01: 14:00:00 via INTRAVENOUS

## 2015-02-01 MED ORDER — NITROGLYCERIN 1 MG/10 ML FOR IR/CATH LAB
INTRA_ARTERIAL | Status: AC
  Filled 2015-02-01: qty 10

## 2015-02-01 MED ORDER — SODIUM CHLORIDE 0.9 % IJ SOLN: 3.0000 mL | INTRAMUSCULAR | Status: DC | PRN

## 2015-02-01 MED ORDER — MIDAZOLAM HCL 2 MG/2ML IJ SOLN
INTRAMUSCULAR | Status: DC | PRN
  Administered 2015-02-01 (×2): 1 mg via INTRAVENOUS

## 2015-02-01 MED ORDER — LIDOCAINE HCL (PF) 1 % IJ SOLN
INTRAMUSCULAR | Status: AC
  Filled 2015-02-01: qty 30

## 2015-02-01 MED ORDER — CANGRELOR TETRASODIUM 50 MG IV SOLR
INTRAVENOUS | Status: AC
  Filled 2015-02-01: qty 50

## 2015-02-01 MED ORDER — LIDOCAINE HCL (PF) 1 % IJ SOLN
INTRAMUSCULAR | Status: DC | PRN
  Administered 2015-02-01: 5 mL via SUBCUTANEOUS

## 2015-02-01 MED ORDER — IOHEXOL 300 MG/ML  SOLN
INTRAMUSCULAR | Status: DC | PRN
  Administered 2015-02-01: 145 mL via INTRA_ARTERIAL

## 2015-02-01 MED ORDER — SODIUM CHLORIDE 0.9 % IV SOLN
50000.0000 ug | INTRAVENOUS | Status: DC | PRN
  Administered 2015-02-01: 4 ug/kg/min via INTRAVENOUS

## 2015-02-01 MED ORDER — FENTANYL CITRATE (PF) 100 MCG/2ML IJ SOLN
INTRAMUSCULAR | Status: AC
  Filled 2015-02-01: qty 2

## 2015-02-01 MED ORDER — ONDANSETRON HCL 4 MG/2ML IJ SOLN
INTRAMUSCULAR | Status: AC
  Filled 2015-02-01: qty 2

## 2015-02-01 MED ORDER — TICAGRELOR 90 MG PO TABS
90.0000 mg | ORAL_TABLET | Freq: Two times a day (BID) | ORAL | Status: DC
  Administered 2015-02-01 – 2015-02-04 (×6): 90 mg via ORAL
  Filled 2015-02-01 (×7): qty 1

## 2015-02-01 MED ORDER — ASPIRIN 81 MG PO CHEW
81.0000 mg | CHEWABLE_TABLET | Freq: Every day | ORAL | Status: DC
  Administered 2015-02-01 – 2015-02-04 (×4): 81 mg via ORAL
  Filled 2015-02-01 (×4): qty 1

## 2015-02-01 MED ORDER — SODIUM CHLORIDE 0.9 % IJ SOLN
3.0000 mL | Freq: Two times a day (BID) | INTRAMUSCULAR | Status: DC
  Administered 2015-02-01 – 2015-02-03 (×4): 3 mL via INTRAVENOUS

## 2015-02-01 MED ORDER — IOHEXOL 350 MG/ML SOLN
100.0000 mL | Freq: Once | INTRAVENOUS | Status: AC | PRN
  Administered 2015-02-01: 100 mL via INTRAVENOUS

## 2015-02-01 MED ORDER — ATORVASTATIN CALCIUM 80 MG PO TABS
80.0000 mg | ORAL_TABLET | Freq: Every day | ORAL | Status: DC
  Administered 2015-02-01 – 2015-02-03 (×3): 80 mg via ORAL
  Filled 2015-02-01 (×4): qty 1

## 2015-02-01 SURGICAL SUPPLY — 28 items
BALLN EMERGE MR 2.0X20 (BALLOONS) ×4
BALLN EMERGE MR 3.5X8 (BALLOONS) ×4
BALLN ~~LOC~~ EMERGE MR 3.25X15 (BALLOONS) ×4
BALLOON EMERGE MR 2.0X20 (BALLOONS) ×2 IMPLANT
BALLOON EMERGE MR 3.5X8 (BALLOONS) ×2 IMPLANT
BALLOON ~~LOC~~ EMERGE MR 3.25X15 (BALLOONS) ×2 IMPLANT
CATH EXTRAC PRONTO 5.5F 138CM (CATHETERS) ×4 IMPLANT
CATH INFINITI 5 FR JL3.5 (CATHETERS) IMPLANT
CATH INFINITI 5FR ANG PIGTAIL (CATHETERS) IMPLANT
CATH INFINITI 5FR MULTPACK ANG (CATHETERS) ×4 IMPLANT
CATH INFINITI JR4 5F (CATHETERS) IMPLANT
DEVICE RAD COMP TR BAND LRG (VASCULAR PRODUCTS) IMPLANT
DEVICE WIRE ANGIOSEAL 6FR (Vascular Products) ×4 IMPLANT
GLIDESHEATH SLEND SS 6F .021 (SHEATH) ×4 IMPLANT
GUIDE CATH RUNWAY 6FR CLS3.5 (CATHETERS) ×4 IMPLANT
KIT ENCORE 26 ADVANTAGE (KITS) ×4 IMPLANT
KIT HEART LEFT (KITS) ×4 IMPLANT
PACK CARDIAC CATHETERIZATION (CUSTOM PROCEDURE TRAY) ×4 IMPLANT
SHEATH PINNACLE 5F 10CM (SHEATH) IMPLANT
SHEATH PINNACLE 6F 10CM (SHEATH) ×4 IMPLANT
STENT SYNERGY DES 3X28 (Permanent Stent) ×4 IMPLANT
SYR MEDRAD MARK V 150ML (SYRINGE) ×4 IMPLANT
TRANSDUCER W/STOPCOCK (MISCELLANEOUS) ×4 IMPLANT
TUBING CIL FLEX 10 FLL-RA (TUBING) ×4 IMPLANT
VALVE GUARDIAN II ~~LOC~~ HEMO (MISCELLANEOUS) ×4 IMPLANT
WIRE ASAHI PROWATER 180CM (WIRE) ×4 IMPLANT
WIRE EMERALD 3MM-J .035X150CM (WIRE) ×4 IMPLANT
WIRE SAFE-T 1.5MM-J .035X260CM (WIRE) ×4 IMPLANT

## 2015-02-01 NOTE — ED Notes (Addendum)
Per EMS, pt coming in from home for centralized chest pain which woke her up his morning. Pts EKG presents as Stemi. Code Stemi called in the field. EMS reported vital 70's systolic BP,  HR 91'Y-78'G, RR: 35, SpO2 100% RA. EMS gave 324 of aspirin in route. Pt arrived to ED and was immediatly transferred to ct to rule out dissection. After CT, pt was taken to cath lab. PT was countinuely monitored and on defib pads. Pts pain was 10 out of 10 central chest. Pt alert x4 throughout. Pt HR: 91, BP: 95/67, SpO2: 100% RA, RR: 30

## 2015-02-01 NOTE — Progress Notes (Signed)
   02/01/15 1000  Clinical Encounter Type  Visited With Family  Visit Type Initial;Pre-op;Code   Chaplain was paged for a code stemi at 9:21 AM. When chaplain arrived, patient was in CT and patient's son was in Trauma A waiting for the patient to return. Chaplain notified patient's son that the plan was for the patient to go straight to cath lab after her CT scan. Chaplain escorted patient's son to the surgical area to get a pager. Patient's son said his uncle and some other relatives may be on their way to the hospital soon. Patient's son was exhausted and said he had been up all night caring for his mother. No further support needs at this time. Page Merrilyn Puma chaplain if patient or patient's family has any support needs later today.  Cranston Neighbor, Chaplain  10:25 AM

## 2015-02-01 NOTE — Progress Notes (Signed)
tHREW UP APPROX 100CC CLEAR TO FAINT BLOOD TINGED EMESIS

## 2015-02-01 NOTE — H&P (Addendum)
History and Physical   Patient ID: Norma Bell MRN: [id number], DOB/AGE: 12-Nov-1965 49 y.o. Date of Encounter: 02/01/2015 49 y.o.  Primary Physician: Almon Hercules., MD Primary Cardiologist: New, Dr Eldridge Dace  Chief Complaint:  STEMI  HPI: Norma Bell is a 49 y.o. female with no history of CAD. She was in her usual state of health yesterday.  Today, she was wakened at 8:30 am by severe chest pain. She was SOB, diaphoretic and nauseated without vomiting. The pain was a 10/10. When her symptoms did not resolve, she called EMS. Her ECG was abnormal and she was given ASA 81 mg x 4. Her BP was 70s systolic so she was given IV fluids, but no morphine or nitro because her pressure was too low. She began to describe the pain as tearing and through to her back.   Because of this, a concern for dissection was raised. She was taken to the CT scanner upon arrival to the ER, chest/abdomen/pelvis. When that was negative for dissection, she was taken directly to the cath lab.   Past Medical History  Diagnosis Date  . Allergy   . Meniere disease   . HOH (hard of hearing)     Bilateral cochlear implants  . Hyperlipidemia LDL goal <70     Surgical History:  Past Surgical History  Procedure Laterality Date  . Appendectomy    . Partial hip arthroplasty Left   . Cochlear implant    . Stunts in both ears    . Tubal ligation    . Joint replacement    . Cochlear implant left ear       I have reviewed the patient's current medications. Medication Sig  albuterol (PROVENTIL HFA;VENTOLIN HFA) 108 (90 BASE) MCG/ACT inhaler Inhale 2 puffs into the lungs every 6 (six) hours as needed for wheezing.  amitriptyline (ELAVIL) 75 MG tablet Take 75 mg by mouth at bedtime.  benzonatate (TESSALON) 100 MG capsule Take 1 capsule (100 mg total) by mouth 3 (three) times daily as needed for cough.  colesevelam (WELCHOL) 625 MG tablet Take 3 tablets by mouth once a day.  HYDROcodone-acetaminophen (NORCO) 5-325 MG per  tablet Take 1 tablet by mouth every 6 (six) hours as needed for severe pain.  naproxen (NAPROSYN) 500 MG tablet Take 1 tablet (500 mg total) by mouth 2 (two) times daily as needed for mild pain, moderate pain or headache (TAKE WITH MEALS.).  norethindrone-ethinyl estradiol-iron (MICROGESTIN FE,GILDESS FE,LOESTRIN FE) 1.5-30 MG-MCG tablet Take 1 tablet by mouth daily.  pravastatin (PRAVACHOL) 40 MG tablet Take 1 tablet (40 mg total) by mouth daily. PATIENT NEEDS OFFICE VISIT FOR ADDITIONAL REFILLS  triamterene-hydrochlorothiazide (MAXZIDE) 75-50 MG per tablet Take 1 tablet by mouth daily.  ZOLPIDEM TARTRATE PO Take 5 mg by mouth.   Scheduled Meds:  Continuous Infusions: . cangrelor 50 mg in NS 250 mL 4 mcg/kg/min (02/01/15 1024)   PRN Meds:.amiodarone, bivalirudin, cangrelor 50 mg in NS 250 mL, cangrelor, fentaNYL, lidocaine (PF), midazolam, potassium chloride  Allergies:  Allergies  Allergen Reactions  . Prilosec [Omeprazole] Swelling    History   Social History  . Marital Status: Married    Spouse Name: N/A  . Number of Children: N/A  . Years of Education: N/A   Occupational History  . student    Social History Main Topics  . Smoking status: Never Smoker   . Smokeless tobacco: Not on file  . Alcohol Use: No  . Drug Use: No  . Sexual Activity: Yes  Birth Control/ Protection: Surgical, Pill   Other Topics Concern  . Not on file   Social History Narrative   Exercises 3x's week 30 min sessions.    Family History  Problem Relation Age of Onset  . Hypertension Mother   . Hypertension Father    Family Status  Relation Status Death Age  . Mother Alive   . Father Alive   . Son Alive   . Son Alive     Review of Systems:   Full 14-point review of systems otherwise negative except as noted above.  Physical Exam: SpO2 99 %. General: Well developed, well nourished,female in acute distress. Head: Normocephalic, atraumatic, sclera non-icteric, no xanthomas, nares are  without discharge. Dentition: good Neck: No carotid bruits. JVD not elevated. No thyromegally Lungs: Good expansion bilaterally. without wheezes or rhonchi.  Heart: Regular rate and rhythm with S1 S2.  No S3 or S4.  No murmur, no rubs, or gallops appreciated. Abdomen: Soft, non-tender, non-distended with normoactive bowel sounds. No hepatomegaly. No rebound/guarding. No obvious abdominal masses. Msk:  Strength and tone appear normal for age. No joint deformities or effusions, no spine or costo-vertebral angle tenderness. Extremities: No clubbing or cyanosis. No edema.  Distal pedal pulses are 2+ in 4 extrem Neuro: Alert and oriented X 3. Moves all extremities spontaneously. No focal deficits noted. Skin: No rashes or lesions noted  Labs: pending  Radiology/Studies: Ct Angio Chest Aorta W/cm &/or Wo/cm 02/01/2015   CLINICAL DATA:  49 year old female with acute severe chest pain radiating to the back, possible code STEMI. Evaluate dissection prior to cath lab. Initial encounter.  EXAM: CT ANGIOGRAPHY CHEST, ABDOMEN AND PELVIS  TECHNIQUE: Multidetector CT imaging through the chest, abdomen and pelvis was performed using the standard protocol during bolus administration of intravenous contrast. Multiplanar reconstructed images and MIPs were obtained and reviewed to evaluate the vascular anatomy.  CONTRAST:  OMNIPAQUE IOHEXOL 350 MG/ML SOLN  COMPARISON:  Chest radiographs 11/15/2008.  FINDINGS: CTA CHEST FINDINGS  Mild respiratory motion artifact. Major airways are patent. Mild dependent pulmonary atelectasis. No pneumothorax, pleural effusion, or other confluent pulmonary opacity.  No pericardial effusion. No mediastinal lymphadenopathy. Negative thoracic inlet. No axillary lymphadenopathy.  No acute osseous abnormality identified.  VASCULAR FINDINGS:  Negative thoracic aorta. Normal proximal great vessels. No aortic dissection, atherosclerosis, or aneurysm.  Central and hilar pulmonary arteries also  are patent.  Questionable abnormal appearance of the left anterior descending Coronary artery (see series 4, images 72 -76) but this might be related to cardiac pulsation artifact  Review of the MIP images confirms the above findings.  CTA ABDOMEN AND PELVIS FINDINGS  No acute osseous abnormality identified. Left hip arthroplasty with metallic streak artifact affecting visualization of the pelvis.  No definite pelvic free fluid. Negative visible uterus, adnexa (tubal ligation clips), and urinary bladder.  Redundant distal colon with retained stool. Decompressed left colon. Retained stool in the transverse colon. Decompressed right colon. No dilated small bowel. Negative stomach and duodenum.  Liver, gallbladder, spleen, pancreas, adrenal glands, and kidneys are within normal limits. No abdominal free fluid or free air. No lymphadenopathy.  VASCULAR FINDINGS:  Negative abdominal aorta. No dissection, atherosclerosis, or aneurysm. Major aortic branches are patent with no branch atherosclerosis identified. Normal aortoiliac bifurcation. Bilateral iliac arteries are patent. Visualized proximal femoral arteries also appear patent and normal.  Review of the MIP images confirms the above findings.  IMPRESSION: 1. Negative for dissection. Normal aorta. Normal central pulmonary arteries. Questionable abnormal appearance of the  LAD, versus pulsation artifact. This study was briefly reviewed in person with ER providers in the CT suite immediately following acquisition. 2. No acute chest, abdomen, or pelvic visceral findings.   Electronically Signed   By: Odessa Fleming M.D.   On: 02/01/2015 10:17    ECG: SR, 1 mm J point elevation V2-4, ST depression in III only  ASSESSMENT AND PLAN:  Principal Problem:   ST elevation (STEMI) myocardial infarction involving left anterior descending coronary artery - She is being taken directly to the cath lab from the CT scanner, with further evaluation and treatment depending on the results.    We will screen for cardiac risk factors and their control. 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    Hypokalemia -  Her potassium will be supplemented and we will check a magnesium level.   Norma Bell 02/01/2015 10:28 AM Beeper [phone]    I have examined the patient and reviewed assessment and plan and discussed with patient.  Agree with above as stated.  I reviewed the ECG prior to her arrival. He was suspicious for an anterior injury pattern. However, when the emergency room physician saw her, there is a concern for aortic dissection. I saw the patient in the ER.  Due to the description of her pain, there was concern for aortic dissection.  SHe described it as tearing and radiating to the back. Instead of coming directly to the Cath Lab, She had an emergency CT scan.  I was present for the CT and saw the images as they were being processed.  I did not see an aortic dissection and so we made a plan to take her emergently to cath once the radiologist confirmed this.  I spoke to the radiologist and ER MD.  The CT was negative for dissection or PE.  They did note that dye could not be seen in the LAD distribution, but that the CT scan was not designed to see coronary arteries.  She continues to have severe chest pain.  ECG shows hyperacute T waves in the anterior precordium and reciprocal changes inferiorly.  She denies any bleeding problems. Communication is difficult due to her hearing loss. She does have cochlear implants but they are functioning intermittently. Will have one of the Cath Lab staff stay with her at the head of the table to aid in communication during the procedure. She is able to read lips.   Hypotensive, appears to be in cardiogenic shock.   In severe distress, [place]/AT, EOMI, hearing loss, RRR, S1 S2, no wheezing, no edema, difficult to palpate femoral or radial pulse.  She should be okay for drug-eluting stent. She does not have any planned surgery. She states that she is compliant with medications when needed. Of note, she does not have any significant family history of early coronary artery disease. She has a nonsmoker. The possibility of spontaneous coronary artery dissection is also present.  Given her hypotension, she may need some type of support device such as a balloon pump. We'll plan for femoral access given that she is very anxious and more prone to spasm and a radial approach.  She has family on the way. I  will discuss the findings of a heart catheterization with them after they arrive.  Critical care time 45 minutes.  Norma Bell S.

## 2015-02-01 NOTE — Progress Notes (Signed)
Ur review done. Spoke w pt. She lives w husband. He has [employer] ins. Gave pt 30day free brilinta card and copay assist card.

## 2015-02-01 NOTE — ED Provider Notes (Signed)
CSN: [id number]     Arrival date & time 02/01/15  4818 History   None    No chief complaint on file.    (Consider location/radiation/quality/duration/timing/severity/associated sxs/prior Treatment) Patient is a 49 y.o. female presenting with chest pain. The history is provided by the patient.  Chest Pain Pain location:  Substernal area Pain quality: tearing   Pain radiates to:  Mid back Pain radiates to the back: yes   Pain severity:  Severe Onset quality:  Sudden Duration:  2 hours Timing:  Constant Progression:  Worsening Chronicity:  New Context: at rest   Relieved by:  Nothing Worsened by:  Nothing tried Ineffective treatments:  None tried Associated symptoms: no abdominal pain, no back pain, no cough, no dizziness, no fatigue, no fever, no headache, no nausea, no shortness of breath and not vomiting     Past Medical History  Diagnosis Date  . Allergy   . Meniere disease    Past Surgical History  Procedure Laterality Date  . Appendectomy    . Partial hip arthroplasty Left   . Cochlear implant    . Stunts in both ears    . Tubal ligation    . Joint replacement    . Cochlear implant left ear     Family History  Problem Relation Age of Onset  . Hypertension Mother   . Hypertension Father    History  Substance Use Topics  . Smoking status: Never Smoker   . Smokeless tobacco: Not on file  . Alcohol Use: No   OB History    No data available     Review of Systems  Constitutional: Negative for fever and fatigue.  HENT: Negative for congestion and drooling.   Eyes: Negative for pain.  Respiratory: Negative for cough and shortness of breath.   Cardiovascular: Positive for chest pain.  Gastrointestinal: Negative for nausea, vomiting, abdominal pain and diarrhea.  Genitourinary: Negative for dysuria and hematuria.  Musculoskeletal: Negative for back pain, gait problem and neck pain.  Skin: Negative for color change.  Neurological: Negative for dizziness and  headaches.  Hematological: Negative for adenopathy.  Psychiatric/Behavioral: Negative for behavioral problems.  All other systems reviewed and are negative.     Allergies  Prilosec  Home Medications   Prior to Admission medications   Medication Sig Start Date End Date Taking? Authorizing Provider  albuterol (PROVENTIL HFA;VENTOLIN HFA) 108 (90 BASE) MCG/ACT inhaler Inhale 2 puffs into the lungs every 6 (six) hours as needed for wheezing. 12/16/12   Maurice March, MD  amitriptyline (ELAVIL) 75 MG tablet Take 75 mg by mouth at bedtime.    Historical Provider, MD  benzonatate (TESSALON) 100 MG capsule Take 1 capsule (100 mg total) by mouth 3 (three) times daily as needed for cough. 11/19/12   Gwenlyn Found Copland, MD  colesevelam (WELCHOL) 625 MG tablet Take 3 tablets by mouth once a day. 08/28/12   Maurice March, MD  HYDROcodone-acetaminophen (NORCO) 5-325 MG per tablet Take 1 tablet by mouth every 6 (six) hours as needed for severe pain. 12/06/14   Mercedes Camprubi-Soms, PA-C  naproxen (NAPROSYN) 500 MG tablet Take 1 tablet (500 mg total) by mouth 2 (two) times daily as needed for mild pain, moderate pain or headache (TAKE WITH MEALS.). 12/06/14   Mercedes Camprubi-Soms, PA-C  norethindrone-ethinyl estradiol-iron (MICROGESTIN FE,GILDESS FE,LOESTRIN FE) 1.5-30 MG-MCG tablet Take 1 tablet by mouth daily.    Historical Provider, MD  pravastatin (PRAVACHOL) 40 MG tablet Take 1 tablet (40 mg  total) by mouth daily. PATIENT NEEDS OFFICE VISIT FOR ADDITIONAL REFILLS 07/27/13   Maurice March, MD  triamterene-hydrochlorothiazide [facility]) 75-50 MG per tablet Take 1 tablet by mouth daily.    Historical Provider, MD  ZOLPIDEM TARTRATE PO Take 5 mg by mouth.    Historical Provider, MD   There were no vitals taken for this visit. Physical Exam  Constitutional: She is oriented to person, place, and time. She appears well-developed and well-nourished. She appears distressed.  HENT:  Head:  Normocephalic.  Mouth/Throat: No oropharyngeal exudate.  Eyes: Conjunctivae and EOM are normal. Pupils are equal, round, and reactive to light.  Neck: Normal range of motion. Neck supple.  Cardiovascular: Normal rate, regular rhythm, normal heart sounds and intact distal pulses.  Exam reveals no gallop and no friction rub.   No murmur heard. Pulmonary/Chest: Effort normal and breath sounds normal. No respiratory distress. She has no wheezes.  Mild tachypnea  Abdominal: Soft. Bowel sounds are normal. There is no tenderness.  Musculoskeletal: Normal range of motion. She exhibits no tenderness.  2+ femoral pulses bilaterally.    Neurological: She is alert and oriented to person, place, and time.  Skin: Skin is warm and dry.  Psychiatric: She has a normal mood and affect. Her behavior is normal.  Nursing note and vitals reviewed.   ED Course  Procedures (including critical care time) Labs Review Labs Reviewed  CBC WITH DIFFERENTIAL/PLATELET - Abnormal; Notable for the following:    Platelets 408 (*)    Neutrophils Relative % 86 (*)    Lymphocytes Relative 10 (*)    All other components within normal limits  MRSA PCR SCREENING  COMPREHENSIVE METABOLIC PANEL  MAGNESIUM  TSH  TROPONIN I  TROPONIN I  TROPONIN I  HEMOGLOBIN A1C  POCT ACTIVATED CLOTTING TIME    Imaging Review Ct Angio Chest Aorta W/cm &/or Wo/cm  02/01/2015   CLINICAL DATA:  49 year old female with acute severe chest pain radiating to the back, possible code STEMI. Evaluate dissection prior to cath lab. Initial encounter.  EXAM: CT ANGIOGRAPHY CHEST, ABDOMEN AND PELVIS  TECHNIQUE: Multidetector CT imaging through the chest, abdomen and pelvis was performed using the standard protocol during bolus administration of intravenous contrast. Multiplanar reconstructed images and MIPs were obtained and reviewed to evaluate the vascular anatomy.  CONTRAST:  OMNIPAQUE IOHEXOL 350 MG/ML SOLN  COMPARISON:  Chest radiographs  11/15/2008.  FINDINGS: CTA CHEST FINDINGS  Mild respiratory motion artifact. Major airways are patent. Mild dependent pulmonary atelectasis. No pneumothorax, pleural effusion, or other confluent pulmonary opacity.  No pericardial effusion. No mediastinal lymphadenopathy. Negative thoracic inlet. No axillary lymphadenopathy.  No acute osseous abnormality identified.  VASCULAR FINDINGS:  Negative thoracic aorta. Normal proximal great vessels. No aortic dissection, atherosclerosis, or aneurysm.  Central and hilar pulmonary arteries also are patent.  Questionable abnormal appearance of the left anterior descending Coronary artery (see series 4, images 72 -76) but this might be related to cardiac pulsation artifact  Review of the MIP images confirms the above findings.  CTA ABDOMEN AND PELVIS FINDINGS  No acute osseous abnormality identified. Left hip arthroplasty with metallic streak artifact affecting visualization of the pelvis.  No definite pelvic free fluid. Negative visible uterus, adnexa (tubal ligation clips), and urinary bladder.  Redundant distal colon with retained stool. Decompressed left colon. Retained stool in the transverse colon. Decompressed right colon. No dilated small bowel. Negative stomach and duodenum.  Liver, gallbladder, spleen, pancreas, adrenal glands, and kidneys are within normal limits. No  abdominal free fluid or free air. No lymphadenopathy.  VASCULAR FINDINGS:  Negative abdominal aorta. No dissection, atherosclerosis, or aneurysm. Major aortic branches are patent with no branch atherosclerosis identified. Normal aortoiliac bifurcation. Bilateral iliac arteries are patent. Visualized proximal femoral arteries also appear patent and normal.  Review of the MIP images confirms the above findings.  IMPRESSION: 1. Negative for dissection. Normal aorta. Normal central pulmonary arteries. Questionable abnormal appearance of the LAD, versus pulsation artifact. This study was briefly reviewed in  person with ER providers in the CT suite immediately following acquisition. 2. No acute chest, abdomen, or pelvic visceral findings.   Electronically Signed   By: Odessa Fleming M.D.   On: 02/01/2015 10:17   Ct Cta Abd/pel W/cm &/or W/o Cm  02/01/2015   CLINICAL DATA:  49 year old female with acute severe chest pain radiating to the back, possible code STEMI. Evaluate dissection prior to cath lab. Initial encounter.  EXAM: CT ANGIOGRAPHY CHEST, ABDOMEN AND PELVIS  TECHNIQUE: Multidetector CT imaging through the chest, abdomen and pelvis was performed using the standard protocol during bolus administration of intravenous contrast. Multiplanar reconstructed images and MIPs were obtained and reviewed to evaluate the vascular anatomy.  CONTRAST:  OMNIPAQUE IOHEXOL 350 MG/ML SOLN  COMPARISON:  Chest radiographs 11/15/2008.  FINDINGS: CTA CHEST FINDINGS  Mild respiratory motion artifact. Major airways are patent. Mild dependent pulmonary atelectasis. No pneumothorax, pleural effusion, or other confluent pulmonary opacity.  No pericardial effusion. No mediastinal lymphadenopathy. Negative thoracic inlet. No axillary lymphadenopathy.  No acute osseous abnormality identified.  VASCULAR FINDINGS:  Negative thoracic aorta. Normal proximal great vessels. No aortic dissection, atherosclerosis, or aneurysm.  Central and hilar pulmonary arteries also are patent.  Questionable abnormal appearance of the left anterior descending Coronary artery (see series 4, images 72 -76) but this might be related to cardiac pulsation artifact  Review of the MIP images confirms the above findings.  CTA ABDOMEN AND PELVIS FINDINGS  No acute osseous abnormality identified. Left hip arthroplasty with metallic streak artifact affecting visualization of the pelvis.  No definite pelvic free fluid. Negative visible uterus, adnexa (tubal ligation clips), and urinary bladder.  Redundant distal colon with retained stool. Decompressed left colon. Retained  stool in the transverse colon. Decompressed right colon. No dilated small bowel. Negative stomach and duodenum.  Liver, gallbladder, spleen, pancreas, adrenal glands, and kidneys are within normal limits. No abdominal free fluid or free air. No lymphadenopathy.  VASCULAR FINDINGS:  Negative abdominal aorta. No dissection, atherosclerosis, or aneurysm. Major aortic branches are patent with no branch atherosclerosis identified. Normal aortoiliac bifurcation. Bilateral iliac arteries are patent. Visualized proximal femoral arteries also appear patent and normal.  Review of the MIP images confirms the above findings.  IMPRESSION: 1. Negative for dissection. Normal aorta. Normal central pulmonary arteries. Questionable abnormal appearance of the LAD, versus pulsation artifact. This study was briefly reviewed in person with ER providers in the CT suite immediately following acquisition. 2. No acute chest, abdomen, or pelvic visceral findings.   Electronically Signed   By: Odessa Fleming M.D.   On: 02/01/2015 10:17     EKG Interpretation None      MDM   Final diagnoses:  Chest pain    9:54 AM 49 y.o. female who presents with chest pain which began around 8:15 8:30 AM this morning. In route she became hypotensive and described a tearing sensation radiating to her back. Story suspicious for aortic dissection. Upon arrival I evaluated the patient as well as the cardiology  nurse from the Cath Lab. We decided to take her to CT immediately to rule out aortic dissection. Dr. Eldridge Dace was present for this. Radiology read the scan immediately which showed no evidence of dissection or PE. The patient was taken to the Cath Lab from CT.    Purvis Sheffield, MD 02/01/15 1623

## 2015-02-01 NOTE — Progress Notes (Signed)
Kengrelor infusion at 113cc/hr.

## 2015-02-02 ENCOUNTER — Encounter (HOSPITAL_COMMUNITY): Payer: Self-pay | Admitting: Interventional Cardiology

## 2015-02-02 ENCOUNTER — Inpatient Hospital Stay (HOSPITAL_COMMUNITY): Payer: Medicare Other

## 2015-02-02 DIAGNOSIS — I2102 ST elevation (STEMI) myocardial infarction involving left anterior descending coronary artery: Principal | ICD-10-CM

## 2015-02-02 DIAGNOSIS — I5041 Acute combined systolic (congestive) and diastolic (congestive) heart failure: Secondary | ICD-10-CM

## 2015-02-02 DIAGNOSIS — E785 Hyperlipidemia, unspecified: Secondary | ICD-10-CM

## 2015-02-02 DIAGNOSIS — I2510 Atherosclerotic heart disease of native coronary artery without angina pectoris: Secondary | ICD-10-CM

## 2015-02-02 LAB — LIPID PANEL
CHOL/HDL RATIO: 6.9 ratio
Cholesterol: 207 mg/dL — ABNORMAL HIGH (ref 0–200)
HDL: 30 mg/dL — AB (ref >40)
LDL CALC: UNDETERMINED mg/dL (ref 0–99)
Triglycerides: 444 mg/dL — ABNORMAL HIGH (ref <150)
VLDL: UNDETERMINED mg/dL (ref 0–40)

## 2015-02-02 LAB — URINALYSIS, ROUTINE W REFLEX MICROSCOPIC
Bilirubin Urine: NEGATIVE
Glucose, UA: NEGATIVE mg/dL
Ketones, ur: 15 mg/dL — AB
Nitrite: POSITIVE — AB
PH: 6 (ref 5.0–8.0)
Protein, ur: NEGATIVE mg/dL
SPECIFIC GRAVITY, URINE: 1.026 (ref 1.005–1.030)
Urobilinogen, UA: 0.2 mg/dL (ref 0.0–1.0)

## 2015-02-02 LAB — BASIC METABOLIC PANEL
Anion gap: 13 (ref 5–15)
BUN: 12 mg/dL (ref 6–20)
CHLORIDE: 101 mmol/L (ref 101–111)
CO2: 23 mmol/L (ref 22–32)
Calcium: 8.2 mg/dL — ABNORMAL LOW (ref 8.9–10.3)
Creatinine, Ser: 1.04 mg/dL — ABNORMAL HIGH (ref 0.44–1.00)
GFR calc Af Amer: >60 mL/min (ref >60)
GFR calc non Af Amer: >60 mL/min (ref >60)
Glucose, Bld: 122 mg/dL — ABNORMAL HIGH (ref 65–99)
Potassium: 3.1 mmol/L — ABNORMAL LOW (ref 3.5–5.1)
Sodium: 137 mmol/L (ref 135–145)

## 2015-02-02 LAB — URINE MICROSCOPIC-ADD ON

## 2015-02-02 LAB — CBC
HCT: 35.2 % — ABNORMAL LOW (ref 36.0–46.0)
Hemoglobin: 12 g/dL (ref 12.0–15.0)
MCH: 31.1 pg (ref 26.0–34.0)
MCHC: 34.1 g/dL (ref 30.0–36.0)
MCV: 91.2 fL (ref 78.0–100.0)
Platelets: 377 10*3/uL (ref 150–400)
RBC: 3.86 MIL/uL — ABNORMAL LOW (ref 3.87–5.11)
RDW: 13.4 % (ref 11.5–15.5)
WBC: 10.2 10*3/uL (ref 4.0–10.5)

## 2015-02-02 LAB — MAGNESIUM
MAGNESIUM: 1.7 mg/dL (ref 1.7–2.4)
Magnesium: 2 mg/dL (ref 1.7–2.4)

## 2015-02-02 LAB — HEMOGLOBIN A1C
Hgb A1c MFr Bld: 6.3 % — ABNORMAL HIGH (ref 4.8–5.6)
Mean Plasma Glucose: 134 mg/dL

## 2015-02-02 LAB — BRAIN NATRIURETIC PEPTIDE: B NATRIURETIC PEPTIDE 5: 299.3 pg/mL — AB (ref 0.0–100.0)

## 2015-02-02 LAB — TROPONIN I: Troponin I: 33.14 ng/mL (ref <0.031)

## 2015-02-02 MED ORDER — POTASSIUM CHLORIDE CRYS ER 20 MEQ PO TBCR
40.0000 meq | EXTENDED_RELEASE_TABLET | Freq: Once | ORAL | Status: AC
  Administered 2015-02-02: 40 meq via ORAL
  Filled 2015-02-02: qty 2

## 2015-02-02 MED ORDER — CARVEDILOL 3.125 MG PO TABS
3.1250 mg | ORAL_TABLET | Freq: Two times a day (BID) | ORAL | Status: DC
  Administered 2015-02-02 – 2015-02-04 (×3): 3.125 mg via ORAL
  Filled 2015-02-02 (×6): qty 1

## 2015-02-02 MED ORDER — LISINOPRIL 2.5 MG PO TABS
2.5000 mg | ORAL_TABLET | Freq: Every day | ORAL | Status: DC
  Administered 2015-02-03 – 2015-02-04 (×2): 2.5 mg via ORAL
  Filled 2015-02-02 (×2): qty 1

## 2015-02-02 MED FILL — Heparin Sodium (Porcine) 2 Unit/ML in Sodium Chloride 0.9%: INTRAMUSCULAR | Qty: 1000 | Status: AC

## 2015-02-02 MED FILL — Nitroglycerin IV Soln 100 MCG/ML in D5W: INTRA_ARTERIAL | Qty: 10 | Status: AC

## 2015-02-02 NOTE — Progress Notes (Signed)
  Echocardiogram 2D Echocardiogram has been performed.  Delcie Roch 02/02/2015, 11:32 AM

## 2015-02-02 NOTE — Progress Notes (Signed)
       Patient Name: Norma Bell Date of Encounter: 02/02/2015    SUBJECTIVE: She has a sense of mild chest discomfort. She states this is been present for weeks. It is dissimilar to the discomfort associated with a myocardial infarction yesterday.  TELEMETRY:  Occasional PVCs are noted. EKG: The tracing this morning reveals decreased anterior forces with mid anterior wall biphasic T waves. No ST elevation is noted.  Filed Vitals:   02/02/15 0500 02/02/15 0600 02/02/15 0700 02/02/15 0800  BP: 105/64 109/64 118/59 111/60  Pulse: 97 101 100 98  Temp:    99.5 F (37.5 C)  TempSrc:    Oral  Resp: 22 23 23 20   Height: 5\' 8"  (1.727 m)     Weight: 87.2 kg (192 lb 3.9 oz)     SpO2: 94% 92% 93% 93%    Intake/Output Summary (Last 24 hours) at 02/02/15 1127 Last data filed at 02/02/15 0900  Gross per 24 hour  Intake    810 ml  Output    850 ml  Net    -40 ml   LABS: Basic Metabolic Panel:  Recent Labs  46/96/29 1406 02/02/15 0337  NA 138 137  K 3.7 3.1*  CL 101 101  CO2 22 23  GLUCOSE 107* 122*  BUN 16 12  CREATININE 1.09* 1.04*  CALCIUM 8.3* 8.2*  MG 1.4* 2.0   CBC:  Recent Labs  02/01/15 1406 02/02/15 0337  WBC 8.9 10.2  NEUTROABS 7.6  --   HGB 13.2 12.0  HCT 37.9 35.2*  MCV 90.5 91.2  PLT 408* 377   Cardiac Enzymes:  Recent Labs  02/01/15 1406 02/01/15 1945 02/01/15 2304  TROPONINI 15.20* 33.40* 33.14*   BNP: Invalid input(s): POCBNP Hemoglobin A1C:  Recent Labs  02/01/15 1406  HGBA1C 6.3*   Fasting Lipid Panel:  Recent Labs  02/02/15 0337  CHOL 207*  HDL 30*  LDLCALC UNABLE TO CALCULATE IF TRIGLYCERIDE OVER 400 mg/dL  TRIG 528*  CHOLHDL 6.9    Radiology/Studies:  CTA of the chest for pulmonary embolism  Physical Exam: Blood pressure 111/60, pulse 98, temperature 99.5 F (37.5 C), temperature source Oral, resp. rate 20, height 5\' 8"  (1.727 m), weight 87.2 kg (192 lb 3.9 oz), SpO2 93 %. Weight change:   Wt Readings from Last 3  Encounters:  02/02/15 87.2 kg (192 lb 3.9 oz)  12/16/12 93.895 kg (207 lb)  11/19/12 92.534 kg (204 lb)    S4 gallop is present Neck veins are flat Chest is clear Cath site in right femoral is unremarkable  ASSESSMENT:  1. Anterior myocardial infarction treated with PCI and proximal LAD stent. 2. Reperfusion ventricular tachycardia requiring he fibrillation in the cath lab. No significant recurrent arrhythmia. 3. Acute systolic heart failure. Systolic function not known. Will assess with an echocardiogram 4. Mnire's disease 5. Hyperlipidemia  Plan:  1. Add low-dose beta blocker therapy and if BP tolerates will add low-dose ACE inhibitor in a.m. 2. 2-D Doppler echocardiogram to assess LV function 3. Begin phase I cardiac rehabilitation 4. Check BNP 5. Potential transfer to telemetry later today if bed needed.  Selinda Eon 02/02/2015, 11:27 AM

## 2015-02-02 NOTE — Progress Notes (Signed)
Nurse called and requested transfer to step down from code bed as no code bed is available. Pt is feeling well and just has mild chest discomfort as per nurse. Will transfer patient to 2H stepdown unit.

## 2015-02-02 NOTE — Progress Notes (Signed)
CARDIAC REHAB PHASE I   PRE:  Rate/Rhythm: 107 ST    BP: sitting 100/63    SaO2: 96 RA  MODE:  Ambulation: to chair   POST:  Rate/Rhythm: 119 ST    BP: sitting 111/62     SaO2:   Pt c/o 2/10 chest tightness/sensation in bed. Sts its worse getting to [facility]. Sts her sensation is similar to admission sx, just much milder. Held ambulation today but assisted pt to recliner. Stated she had 5/10 chest squeezing with getting up to recliner. Back to 2/10 with rest.  HR up to 119 ST getting to recliner. Began ed with pt and husband. Will continue tomorrow. [phone]  Norma Bell Norma Bell CES, ACSM 02/02/2015 2:48 PM

## 2015-02-02 NOTE — Progress Notes (Signed)
Patient stated her mid chest hurt 3/10 with tingling radiating down both arms.  Patient repositioned, EKG done and B. Leron Croak, PA notified.  Per PA, EKG unchanged, and pt is okay to transfer to stepdown.

## 2015-02-03 DIAGNOSIS — I5021 Acute systolic (congestive) heart failure: Secondary | ICD-10-CM

## 2015-02-03 DIAGNOSIS — I5020 Unspecified systolic (congestive) heart failure: Secondary | ICD-10-CM

## 2015-02-03 LAB — BASIC METABOLIC PANEL
ANION GAP: 8 (ref 5–15)
Anion gap: 8 (ref 5–15)
BUN: 12 mg/dL (ref 6–20)
BUN: 13 mg/dL (ref 6–20)
CHLORIDE: 104 mmol/L (ref 101–111)
CO2: 26 mmol/L (ref 22–32)
CO2: 25 mmol/L (ref 22–32)
CREATININE: 0.87 mg/dL (ref 0.44–1.00)
Calcium: 8.1 mg/dL — ABNORMAL LOW (ref 8.9–10.3)
Calcium: 8.7 mg/dL — ABNORMAL LOW (ref 8.9–10.3)
Chloride: 104 mmol/L (ref 101–111)
Creatinine, Ser: 0.93 mg/dL (ref 0.44–1.00)
GFR calc Af Amer: >60 mL/min (ref >60)
GFR calc non Af Amer: >60 mL/min (ref >60)
GFR calc non Af Amer: >60 mL/min (ref >60)
GLUCOSE: 105 mg/dL — AB (ref 65–99)
GLUCOSE: 106 mg/dL — AB (ref 65–99)
POTASSIUM: 3 mmol/L — AB (ref 3.5–5.1)
POTASSIUM: 3.3 mmol/L — AB (ref 3.5–5.1)
Sodium: 138 mmol/L (ref 135–145)
Sodium: 137 mmol/L (ref 135–145)

## 2015-02-03 MED ORDER — MAGNESIUM SULFATE 2 GM/50ML IV SOLN
2.0000 g | Freq: Once | INTRAVENOUS | Status: AC
  Administered 2015-02-03: 2 g via INTRAVENOUS
  Filled 2015-02-03: qty 50

## 2015-02-03 MED ORDER — POTASSIUM CHLORIDE 20 MEQ/15ML (10%) PO SOLN
60.0000 meq | Freq: Once | ORAL | Status: AC
  Administered 2015-02-03: 60 meq via ORAL
  Filled 2015-02-03: qty 45

## 2015-02-03 NOTE — Progress Notes (Signed)
       Patient Name: Norma Bell Date of Encounter: 02/03/2015    SUBJECTIVE: Says she feels well. No chest discomfort. No dizziness.  TELEMETRY:  Normal sinus rhythm Filed Vitals:   02/03/15 0000 02/03/15 0325 02/03/15 0751 02/03/15 0933  BP: 110/70 112/63 90/61 113/76  Pulse: 97 92 91   Temp:  98.3 F (36.8 C) 98.1 F (36.7 C)   TempSrc:  Oral Oral   Resp: 18 21 17    Height:      Weight:  87.3 kg (192 lb 7.4 oz)    SpO2: 95% 93% 93%     Intake/Output Summary (Last 24 hours) at 02/03/15 1047 Last data filed at 02/02/15 1800  Gross per 24 hour  Intake    480 ml  Output    350 ml  Net    130 ml   LABS: Basic Metabolic Panel:  Recent Labs  76/73/41 0337 02/02/15 2130 02/03/15 0232  NA 137  --  138  K 3.1*  --  3.0*  CL 101  --  104  CO2 23  --  26  GLUCOSE 122*  --  105*  BUN 12  --  12  CREATININE 1.04*  --  0.93  CALCIUM 8.2*  --  8.1*  MG 2.0 1.7  --    CBC:  Recent Labs  02/01/15 1406 02/02/15 0337  WBC 8.9 10.2  NEUTROABS 7.6  --   HGB 13.2 12.0  HCT 37.9 35.2*  MCV 90.5 91.2  PLT 408* 377   Cardiac Enzymes:  Recent Labs  02/01/15 1406 02/01/15 1945 02/01/15 2304  TROPONINI 15.20* 33.40* 33.14*   BNP: Invalid input(s): POCBNP Hemoglobin A1C:  Recent Labs  02/01/15 1406  HGBA1C 6.3*   Fasting Lipid Panel:  Recent Labs  02/02/15 0337  CHOL 207*  HDL 30*  LDLCALC UNABLE TO CALCULATE IF TRIGLYCERIDE OVER 400 mg/dL  TRIG 937*  CHOLHDL 6.9    Radiology/Studies:  CXR: no new radiologic data  Physical Exam: Blood pressure 113/76, pulse 91, temperature 98.1 F (36.7 C), temperature source Oral, resp. rate 17, height 5\' 8"  (1.727 m), weight 87.3 kg (192 lb 7.4 oz), SpO2 93 %. Weight change: 0.1 kg (3.5 oz)  Wt Readings from Last 3 Encounters:  02/03/15 87.3 kg (192 lb 7.4 oz)  12/16/12 93.895 kg (207 lb)  11/19/12 92.534 kg (204 lb)    S4 gallop Clear lung fields No edema Femoral access site is  unremarkable  ASSESSMENT:  1. Recent acute anterior myocardial infarction, patient is recovering well. 2. Acute systolic heart failure, LVEF 40-45%. Suspect will improve as stunned myocardium improves 3. Hyperlipidemia 4. Hypokalemia  Plan:  1. Aggressively reviewed please potassium 2. Beta blocker and ACE inhibitor therapy will be short-term as LV function is not severely impaired 3. Transfer to telemetry 4. Possibly home in a.m.  Selinda Eon 02/03/2015, 10:47 AM

## 2015-02-03 NOTE — Progress Notes (Signed)
CARDIAC REHAB PHASE I   PRE:  Rate/Rhythm: 91 SR    BP: sitting 108/66    SaO2: 97 RA  MODE:  Ambulation: 350 ft   POST:  Rate/Rhythm: 118 ST    BP: sitting 120/69     SaO2:   Pt feeling better today. Sts she might have minimal chest tightness but mostly she just feels pain when she moves or coughs or presses on her chest. Able to walk without sx. Had to rest x1 toward end of walk due to fatigue. HR 118 ST. Finished ed with pt and husband. Voiced understanding. Interested in CRPII and will send referral to [facility]. Encouraged pt to walk more. [phone]   [name] [place] CES, ACSM 02/03/2015 12:14 PM

## 2015-02-04 LAB — BASIC METABOLIC PANEL
ANION GAP: 10 (ref 5–15)
BUN: 13 mg/dL (ref 6–20)
CALCIUM: 8.4 mg/dL — AB (ref 8.9–10.3)
CO2: 21 mmol/L — AB (ref 22–32)
Chloride: 106 mmol/L (ref 101–111)
Creatinine, Ser: 1 mg/dL (ref 0.44–1.00)
GFR calc Af Amer: >60 mL/min (ref >60)
GLUCOSE: 83 mg/dL (ref 65–99)
POTASSIUM: 3.5 mmol/L (ref 3.5–5.1)
Sodium: 137 mmol/L (ref 135–145)

## 2015-02-04 MED ORDER — LISINOPRIL 2.5 MG PO TABS: 2.5000 mg | ORAL_TABLET | Freq: Every day | ORAL | Status: DC

## 2015-02-04 MED ORDER — NITROGLYCERIN 0.4 MG SL SUBL: 0.4000 mg | SUBLINGUAL_TABLET | SUBLINGUAL | Status: DC | PRN

## 2015-02-04 MED ORDER — TICAGRELOR 90 MG PO TABS: 90.0000 mg | ORAL_TABLET | Freq: Two times a day (BID) | ORAL | Status: DC

## 2015-02-04 MED ORDER — ALBUTEROL SULFATE HFA 108 (90 BASE) MCG/ACT IN AERS: 2.0000 | INHALATION_SPRAY | Freq: Four times a day (QID) | RESPIRATORY_TRACT | Status: DC | PRN

## 2015-02-04 MED ORDER — CARVEDILOL 3.125 MG PO TABS: 3.1250 mg | ORAL_TABLET | Freq: Two times a day (BID) | ORAL | Status: DC

## 2015-02-04 MED ORDER — ASPIRIN 81 MG PO CHEW: 81.0000 mg | CHEWABLE_TABLET | Freq: Every day | ORAL | Status: DC

## 2015-02-04 MED ORDER — ATORVASTATIN CALCIUM 80 MG PO TABS: 80.0000 mg | ORAL_TABLET | Freq: Every day | ORAL | Status: DC

## 2015-02-04 NOTE — Progress Notes (Signed)
1100 Offered to walk with pt. Pt stated she would walk with her husband as he is leaving soon and that would be their time together. Will follow up later if not discharged. Luetta Nutting RN BSN 02/04/2015 11:02 AM

## 2015-02-04 NOTE — Progress Notes (Signed)
Patient Name: Norma Bell Date of Encounter: 02/04/2015    SUBJECTIVE: No chest discomfort. She is concerned about taking her birth control pill. 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 Patient is been on the birth control pill for greater than a year.  TELEMETRY:  Normal sinus rhythm: Filed Vitals:   02/03/15 1304 02/03/15 1814 02/03/15 2014 02/04/15 0500  BP: 108/66 105/66 108/57 103/62  Pulse: 88 98 88 82  Temp: 98.3 F (36.8 C)  98.4 F (36.9 C) 97.7 F (36.5 C)  TempSrc: Oral  Oral Oral  Resp: Height:      Weight:    87.544 kg (193 lb)  SpO2: 98%  98% 98%    Intake/Output Summary (Last 24 hours) at 02/04/15 1206 Last data filed at 02/04/15 1022  Gross per 24 hour  Intake    720 ml  Output      0 ml  Net    720 ml   LABS: Basic Metabolic Panel:  Recent Labs  16/10/96 0337 02/02/15 2130  02/03/15 1630 02/04/15 0418  NA 137  --   < > 137 137  K 3.1*  --   < > 3.3* 3.5  CL 101  --   < > 104 106  CO2 23  --   < > 25 21*  GLUCOSE 122*  --   < > 106* 83  BUN 12  --   < > 13 13  CREATININE 1.04*  --   < > 0.87 1.00  CALCIUM 8.2*  --   < > 8.7* 8.4*  MG 2.0 1.7  --   --   --   < > = values in this interval not displayed. CBC:  Recent Labs  02/01/15 1406 02/02/15 0337  WBC 8.9 10.2  NEUTROABS 7.6  --   HGB 13.2 12.0  HCT 37.9 35.2*  MCV 90.5 91.2  PLT 408* 377   Cardiac Enzymes:  Recent Labs  02/01/15 1406 02/01/15 1945 02/01/15 2304  TROPONINI 15.20* 33.40* 33.14*   BNP: Invalid input(s): POCBNP Hemoglobin A1C:  Recent Labs  02/01/15 1406  HGBA1C 6.3*   Fasting Lipid Panel:  Recent Labs  02/02/15 0337  CHOL 207*  HDL 30*  LDLCALC UNABLE TO CALCULATE IF TRIGLYCERIDE OVER 400 mg/dL  TRIG 045*  CHOLHDL 6.9    Radiology/Studies:  No new data  Physical Exam: Blood pressure 103/62, pulse 82, temperature 97.7 F  (36.5 C), temperature source Oral, resp. rate 18, height  (1.727 m), weight 87.544 kg (193 lb), SpO2 98 %. Weight change: 0.244 kg (8.6 oz)  Wt Readings from Last 3 Encounters:  02/04/15 87.544 kg (193 lb)  12/16/12 93.895 kg (207 lb)  11/19/12 92.534 kg (204 lb)    Patient is lying flat Chest is clear No gallop or rub or murmur is heard No edema and no evidence of right groin hematoma  ASSESSMENT:  1. Status post anterior myocardial infarction treated with stenting and asymptomatic at this time. 2. Hyperlipidemia 3. Birth control pills that are Estrogen containing 4. History of Mnire's  Plan:   Home today  Consider discontinuing the birth control pill but will leave to the primary care physician. The patient is resistant to stop this medication because it has some impact on management firm min years disease.  Clinical follow-up with primary cardiologist 7-10 days.  Selinda Eon 02/04/2015, 12:06 PM

## 2015-02-04 NOTE — Care Management Note (Signed)
Case Management Note  Patient Details  Name: Norma Bell MRN: [id number] Date of Birth: 10-Oct-1965  Subjective/Objective:  Pt admitted with STEMI                  Action/Plan: Pt discharged home on Brilinta.  CM assisted with initation of Brilinta at home.  CM continued to monitor for disposition needs   Expected Discharge Date:                  Expected Discharge Plan:  Home/Self Care  In-House Referral:     Discharge planning Services  CM Consult, Medication Assistance  Post Acute Care Choice:    Choice offered to:     DME Arranged:    DME Agency:     HH Arranged:    HH Agency:     Status of Service:  Completed, signed off  Medicare Important Message Given:  No Date Medicare IM Given:    Medicare IM give by:    Date Additional Medicare IM Given:    Additional Medicare Important Message give by:     If discussed at Long Length of Stay Meetings, dates discussed:    Additional Comments: CM verified that pt has both free 30 day card and free copay card, pt voiced understanding of card and how to use cards.  Pt chose [facility] on [address] as preferred pharmacy, however pharmacy unable to fill before Monday.  Pt second choice; [facility] on [address], CM verified with pharmacy that prescription is available now for pick up.  CM communicated this to pt. Cherylann Parr, RN 02/04/2015, 12:14 PM

## 2015-02-04 NOTE — Progress Notes (Signed)
IV and tele monitor d/c at this time; pt and husband given d/c instructions; all verbalized understanding; PA to see pt prior to d/c home; will cont. To monitor.

## 2015-02-04 NOTE — Discharge Summary (Signed)
Physician Discharge Summary     Cardiologist:  Eldridge Dace Patient ID: Norma Bell MRN: [id number] DOB/AGE: 1966/05/11 49 y.o.  Admit date: 02/01/2015 Discharge date: 02/04/2015  Admission Diagnoses:  STEMI involving left anterior descending coronary artery  Discharge Diagnoses:  Principal Problem:   ST elevation (STEMI) myocardial infarction involving left anterior descending coronary artery Active Problems:   Cochlear implant in place   Hyperlipidemia LDL goal <70   Acute systolic heart failure   Hypokalemia  Discharged Condition: stable  Hospital Course:   Norma Bell is a 49 y.o. female with no history of CAD. She was in her usual state of health. She was wakened at 8:30 am by severe chest pain. She was SOB, diaphoretic and nauseated without vomiting. The pain was a 10/10. When her symptoms did not resolve, she called EMS. Her ECG was abnormal and she was given ASA 81 mg x 4. Her BP was 70s systolic so she was given IV fluids, but no morphine or nitro because her pressure was too low. She began to describe the pain as tearing and through to her back.  Because of this, a concern for dissection was raised. She was taken to the CT scanner upon arrival to the ER, chest/abdomen/pelvis. When that was negative for dissection, she was taken directly to the cath lab.  She had a 100% occluded LAD which was successfully stented after aspiration thrombectomy.   Echocardiogram revealed an EF of 45-50%. Distal septal and apical hypokinesis.  She was started on low dose beta blocker, ACE-I and high dose statin(See lipid panel below).  Phase one CR initiated and the patient is interested in phase II.  She ambulated without difficulties.  Hypokalemia was replaced.  The patient was seen by Dr. Katrinka Blazing who felt she was stable for DC home.       Consults: Cardiac Rehab  Significant Diagnostic Studies:  Lipid Panel     Component Value Date/Time   CHOL 207* 02/02/2015 0337   TRIG 444* 02/02/2015 0337   HDL 30* 02/02/2015 0337   CHOLHDL 6.9 02/02/2015 0337   VLDL UNABLE TO CALCULATE IF TRIGLYCERIDE OVER 400 mg/dL 40/98/1191 4782   LDLCALC UNABLE TO CALCULATE IF TRIGLYCERIDE OVER 400 mg/dL 95/62/1308 6578   LDLDIRECT 125* 04/08/2012 1513     Echocardiogram Study Conclusions  - Left ventricle: Distal septal and apical hypokinesis. The cavity size was normal. Wall thickness was normal. Systolic function was mildly reduced. The estimated ejection fraction was in the range of 45% to 50%. - Atrial septum: No defect or patent foramen ovale was identified.   Left heart cath  Acute anterior ST elevation MI due to thrombotic LAD occlusion. This was successfully treated with aspiration thrombectomy and drug-eluting stent placement with a 3.0 x 28 millimeter Synergy drug-eluting stent, postdilated to 3.3 mm in diameter.  Continue dual antiplatelets therapy for at least a year. She'll need her lipids checked. She only aggressive secondary prevention including regular exercise and diet control. I anticipate that she will be in the hospital for a few days. Will check echocardiogram tomorrow. I suspect she has significantly decreased left ventricular function due to her elevated LVEDP. She received a dose of amiodarone IV in the Cath Lab. After this, her rhythm remained stable. Her pain was much improved. Post-Intervention Diagram          Treatments:  See above  Discharge Exam: Blood pressure 103/62, pulse 82, temperature 97.7 F (36.5 C), temperature source Oral, resp. rate 18, height  (1.727 m),  weight 193 lb (87.544 kg), SpO2 98 %.   Disposition: 01-Home or Self Care      Discharge Instructions    Amb Referral to Cardiac Rehabilitation    Complete by:  As directed   Congestive Heart Failure: If diagnosis is Heart Failure, patient MUST meet each of the CMS criteria: 1. Left Ventricular Ejection Fraction </= 35% 2. NYHA class II-IV symptoms despite being on optimal heart  failure therapy for at least 6 weeks. 3. Stable = have not had a recent (<6 weeks) or planned (<6 months) major cardiovascular hospitalization or procedure  Program Details: - Physician supervised classes - 1-3 classes per week over a 12-18 week period, generally for a total of 36 sessions  Physician Certification: I certify that the above Cardiac Rehabilitation treatment is medically necessary and is medically approved by me for treatment of this patient. The patient is willing and cooperative, able to ambulate and medically stable to participate in exercise rehabilitation. The participant's progress and Individualized Treatment Plan will be reviewed by the Medical Director, Cardiac Rehab staff and as indicated by the Referring/Ordering Physician.  Diagnosis:   Myocardial Infarction PCI              Medication List    STOP taking these medications        EPIPEN 2-PAK 0.3 mg/0.3 mL Soaj injection  Generic drug:  EPINEPHrine     norethindrone-ethinyl estradiol-iron 1.5-30 MG-MCG tablet  Commonly known as:  MICROGESTIN FE,GILDESS FE,LOESTRIN FE     triamterene-hydrochlorothiazide 75-50 MG per tablet  Commonly known as:  MAXZIDE      TAKE these medications        albuterol 108 (90 BASE) MCG/ACT inhaler  Commonly known as:  PROVENTIL HFA;VENTOLIN HFA  Inhale 2 puffs into the lungs every 6 (six) hours as needed for wheezing.     amitriptyline 75 MG tablet  Commonly known as:  ELAVIL  Take 75 mg by mouth at bedtime.     aspirin 81 MG chewable tablet  Chew 1 tablet (81 mg total) by mouth daily.     atorvastatin 80 MG tablet  Commonly known as:  LIPITOR  Take 1 tablet (80 mg total) by mouth daily at 6 PM.     carvedilol 3.125 MG tablet  Commonly known as:  COREG  Take 1 tablet (3.125 mg total) by mouth 2 (two) times daily with a meal.     lisinopril 2.5 MG tablet  Commonly known as:  PRINIVIL,ZESTRIL  Take 1 tablet (2.5 mg total) by mouth daily.     nitroGLYCERIN 0.4  MG SL tablet  Commonly known as:  NITROSTAT  Place 1 tablet (0.4 mg total) under the tongue every 5 (five) minutes x 3 doses as needed for chest pain.     ticagrelor 90 MG Tabs tablet  Commonly known as:  BRILINTA  Take 1 tablet (90 mg total) by mouth 2 (two) times daily.     ZOLPIDEM TARTRATE PO  Take 5 mg by mouth at bedtime as needed.       Follow-up Information    Follow up with Corky Crafts., MD.   Specialties:  Cardiology, Radiology, Interventional Cardiology   Why:  The office will call you with the follow up appt date and time.   Contact information:   [address] [place] [address] [phone]      Greater than 30 minutes was spent completing the patient's discharge.    Signed: Wilburt Finlay, PAC  02/04/2015, 12:22 PM

## 2015-02-07 ENCOUNTER — Other Ambulatory Visit: Payer: Self-pay

## 2015-02-07 MED ORDER — TICAGRELOR 90 MG PO TABS: 90.0000 mg | ORAL_TABLET | Freq: Two times a day (BID) | ORAL | Status: DC

## 2015-02-08 ENCOUNTER — Telehealth: Payer: Self-pay

## 2015-02-08 NOTE — Telephone Encounter (Signed)
Prior auth obtained for Brilinta 90mg  bid through [facility]. Good for one year. Berkley HarveyAuth #[id number]. Pharmacy notified.

## 2015-02-10 ENCOUNTER — Telehealth: Payer: Self-pay

## 2015-02-10 NOTE — Telephone Encounter (Signed)
Per [facility], Brilinta 90mg  approved for 1 year. PA# [id number].

## 2015-02-16 ENCOUNTER — Encounter: Payer: Self-pay | Admitting: Physician Assistant

## 2015-02-16 ENCOUNTER — Ambulatory Visit (INDEPENDENT_AMBULATORY_CARE_PROVIDER_SITE_OTHER): Payer: 59 | Admitting: Physician Assistant

## 2015-02-16 VITALS — BP 98/60 | HR 81 | Ht 68.0 in | Wt 185.8 lb

## 2015-02-16 DIAGNOSIS — E785 Hyperlipidemia, unspecified: Secondary | ICD-10-CM | POA: Diagnosis not present

## 2015-02-16 DIAGNOSIS — I951 Orthostatic hypotension: Secondary | ICD-10-CM

## 2015-02-16 DIAGNOSIS — I5022 Chronic systolic (congestive) heart failure: Secondary | ICD-10-CM

## 2015-02-16 DIAGNOSIS — R071 Chest pain on breathing: Secondary | ICD-10-CM

## 2015-02-16 DIAGNOSIS — I2510 Atherosclerotic heart disease of native coronary artery without angina pectoris: Secondary | ICD-10-CM

## 2015-02-16 DIAGNOSIS — I2583 Coronary atherosclerosis due to lipid rich plaque: Principal | ICD-10-CM

## 2015-02-16 DIAGNOSIS — R079 Chest pain, unspecified: Secondary | ICD-10-CM | POA: Insufficient documentation

## 2015-02-16 NOTE — Assessment & Plan Note (Addendum)
Systolic blood pressure is on the low side and patient is complaining of cough since discharge. I'm going to stop her lisinopril.  Request that she do daily weight monitoring to make sure she's not retaining fluid.

## 2015-02-16 NOTE — Assessment & Plan Note (Addendum)
As can be related to her Mnire's disease however, she is hypotensive. We checked orthostatic blood pressures she was not obviously orthostatic however when she stood up, that the automatic blood pressure cuff could not get a reading until she had been standing there about 3 or 4 minutes and finally read 98 systolic.  We rechecked sitting to standing and upon standing she dropped to 80/60.  I'm also going to stop her hydrochlorothiazide triamterene.

## 2015-02-16 NOTE — Assessment & Plan Note (Signed)
It sounds like she could have a post MI pericarditis. The EKG doesn't show any ST elevations and there is no rub on exam. Continue to monitor.

## 2015-02-16 NOTE — Assessment & Plan Note (Signed)
We'll recheck patient's lipid panel in 2 months. We'll also check LFTs in about 4-5 weeks. Continue statin.

## 2015-02-16 NOTE — Patient Instructions (Addendum)
Medication Instructions:  Your physician has recommended you make the following change in your medication:   1-Stop Lisinopril  2- Stop Maxzide (triamterene-hydrochlorothiazide)  Labwork: Your physician recommends that you return for lab work in: 4 to 5 weeks for a hepatic panel.  Your physician recommends that you return for lab work in: 2 months for a fasting lipid panel.  Testing/Procedures: NONE  Follow-Up: Your physician recommends that you schedule a follow-up appointment in: 3 months with Dr. Eldridge DaceVaranasi.   Your physician recommends that you weigh, daily, at the same time every day, and in the same amount of clothing. Please record your daily weights and if you gain 3 pounds in 24 hour period or 5 pounds in one week, call our office at [phone].

## 2015-02-16 NOTE — Assessment & Plan Note (Signed)
Status post stent to the LAD for her percent occlusion. She is on aspirin and Brilinta. She has not missed any doses. We'll continue Coreg. However I'm stopping lisinopril due to cough

## 2015-02-16 NOTE — Progress Notes (Signed)
Patient ID: Norma Bell, female   DOB: Jun 19, 1966, 49 y.o.   MRN: [id number]    Date:  02/16/2015   ID:  Norma Bell, DOB [birth year], MRN [id number]  PCP:  Dow Adolph, MD  Primary Cardiologist:  Eldridge Dace   Chief complaint: Post hospital follow-up   History of Present Illness: Norma Bell is a 49 y.o. female   Norma Bell is a 49 y.o. female with no history of CAD. She was in her usual state of health. She was wakened at 8:30 am by severe chest pain. She was SOB, diaphoretic and nauseated without vomiting. The pain was a 10/10. When her symptoms did not resolve, she called EMS. Her ECG was abnormal and she was given ASA 81 mg x 4. Her BP was 70s systolic so she was given IV fluids, but no morphine or nitro because her pressure was too low. She began to describe the pain as tearing and through to her back. Because of this, a concern for dissection was raised. She was taken to the CT scanner upon arrival to the ER, chest/abdomen/pelvis. When that was negative for dissection, she was taken directly to the cath lab. She had a 100% occluded LAD which was successfully stented after aspiration thrombectomy. Echocardiogram revealed an EF of 45-50%. Distal septal and apical hypokinesis. She was started on low dose beta blocker, ACE-I and high dose statin(See lipid panel below). Phase one CR initiated and the patient is interested in phase II. She ambulated without difficulties.  She presents for posthospital follow-up. She reports doing fairly well however, she gets dizzy upon standing, or just changing position and general. She also has been having chest pain which she says is worse with exertion. Upon further questioning, it is also worse if she coughs, takes a deep breath or pushes on her chest. She also has been coughing fairly frequently since her discharge. It is mostly a dry cough.  She has been trying to walk about 10-15 minutes a day and spreading it out. She has been very  tired and fatigued. She is scheduled to go to cardiac rehabilitation orientation on Thursday.   The patient currently denies nausea, vomiting, fever, shortness of breath, orthopnea, PND, abdominal pain, hematochezia, melena, lower extremity edema, claudication.  Wt Readings from Last 3 Encounters:  02/16/15 185 lb 12.8 oz (84.278 kg)  02/04/15 193 lb (87.544 kg)  12/16/12 207 lb (93.895 kg)    Lipid Panel     Component Value Date/Time   CHOL 207* 02/02/2015 0337   TRIG 444* 02/02/2015 0337   HDL 30* 02/02/2015 0337   CHOLHDL 6.9 02/02/2015 0337   VLDL UNABLE TO CALCULATE IF TRIGLYCERIDE OVER 400 mg/dL 78/29/5621 3086   LDLCALC UNABLE TO CALCULATE IF TRIGLYCERIDE OVER 400 mg/dL 57/84/6962 9528   LDLDIRECT 125* 04/08/2012 1513     Past Medical History  Diagnosis Date  . Allergy   . Meniere disease   . HOH (hard of hearing)     Bilateral cochlear implants  . Hyperlipidemia LDL goal <70     Current Outpatient Prescriptions  Medication Sig Dispense Refill  . albuterol (PROVENTIL HFA;VENTOLIN HFA) 108 (90 BASE) MCG/ACT inhaler Inhale 2 puffs into the lungs every 6 (six) hours as needed for wheezing. 1 Inhaler 2  . amitriptyline (ELAVIL) 75 MG tablet Take 75 mg by mouth at bedtime.    Marland Kitchen aspirin 81 MG chewable tablet Chew 1 tablet (81 mg total) by mouth daily.    Marland Kitchen atorvastatin (LIPITOR) 80 MG tablet Take 1  tablet (80 mg total) by mouth daily at 6 PM. 30 tablet 11  . carvedilol (COREG) 3.125 MG tablet Take 1 tablet (3.125 mg total) by mouth 2 (two) times daily with a meal. 60 tablet 11  . nitroGLYCERIN (NITROSTAT) 0.4 MG SL tablet Place 1 tablet (0.4 mg total) under the tongue every 5 (five) minutes x 3 doses as needed for chest pain. 25 tablet 6  . ticagrelor (BRILINTA) 90 MG TABS tablet Take 1 tablet (90 mg total) by mouth 2 (two) times daily. 60 tablet 0  . ZOLPIDEM TARTRATE PO Take 5 mg by mouth at bedtime as needed.      No current facility-administered medications for this  visit.    Allergies:    Allergies  Allergen Reactions  . Sucralfate Itching and Swelling    Face/tongue   . Ranitidine Swelling    flushing  . Prilosec [Omeprazole] Swelling  . Other Rash    Oranges, grapes, additives, preservatives, and Granny Smith apples (red apples OK). - Breaks out in what looks like bites. Some trouble breathing.    Social History:  The patient  reports that she has never smoked. She does not have any smokeless tobacco history on file. She reports that she does not drink alcohol or use illicit drugs.   Family history:   Family History  Problem Relation Age of Onset  . Hypertension Mother   . Hypertension Father     ROS:  Please see the history of present illness.  All other systems reviewed and negative.   PHYSICAL EXAM: VS:  BP 98/60 mmHg  Pulse 81  Ht  (1.727 m)  Wt 185 lb 12.8 oz (84.278 kg)  BMI 28.26 kg/m2 Well nourished, well developed, in no acute distress HEENT: Pupils are equal round react to light accommodation extraocular movements are intact.  Neck: no JVDNo cervical lymphadenopathy. Cardiac: Regular rate and rhythm without murmurs.  No rub noted. Lungs:  clear to auscultation bilaterally, no wheezing, rhonchi or rales Abd: soft, nontender, positive bowel sounds all quadrants, no hepatosplenomegaly Ext: no lower extremity edema.  2+ radial and dorsalis pedis pulses. Skin: warm and dry Neuro:  Grossly normal  EKG:  Sinus rhythm with a rate of 81 bpm anterior lateral T-wave flattening.  ASSESSMENT AND PLAN:  Problem List Items Addressed This Visit    Systolic heart failure    Systolic blood pressure is on the low side and patient is complaining of cough since discharge. I'm going to stop her lisinopril.  Request that she do daily weight monitoring to make sure she's not retaining fluid.      Orthostatic hypotension    As can be related to her Mnire's disease however, she is hypotensive. We checked orthostatic blood pressures  she was not obviously orthostatic however when she stood up, that the automatic blood pressure cuff could not get a reading until she had been standing there about 3 or 4 minutes and finally read 98 systolic.  We rechecked sitting to standing and upon standing she dropped to 80/60.  I'm also going to stop her hydrochlorothiazide triamterene.      Hyperlipidemia LDL goal <70    We'll recheck patient's lipid panel in 2 months. We'll also check LFTs in about 4-5 weeks. Continue statin.      Coronary artery disease due to lipid rich plaque - Primary    Status post stent to the LAD for her percent occlusion. She is on aspirin and Brilinta. She has not missed  any doses. We'll continue Coreg. However I'm stopping lisinopril due to cough      Relevant Orders   EKG 12-Lead   Hepatic function panel   Chest pain    It sounds like she could have a post MI pericarditis. The EKG doesn't show any ST elevations and there is no rub on exam. Continue to monitor.       Other Visit Diagnoses    Hyperlipidemia        Relevant Orders    Lipid Profile

## 2015-02-24 ENCOUNTER — Encounter (HOSPITAL_COMMUNITY)
Admission: RE | Admit: 2015-02-24 | Discharge: 2015-02-24 | Disposition: A | Discharge status: Home / Self Care | Payer: 59 | Source: Ambulatory Visit | Attending: Interventional Cardiology | Admitting: Interventional Cardiology

## 2015-02-24 DIAGNOSIS — Z79899 Other long term (current) drug therapy: Secondary | ICD-10-CM | POA: Insufficient documentation

## 2015-02-24 DIAGNOSIS — Z48812 Encounter for surgical aftercare following surgery on the circulatory system: Secondary | ICD-10-CM | POA: Insufficient documentation

## 2015-02-24 DIAGNOSIS — E785 Hyperlipidemia, unspecified: Secondary | ICD-10-CM | POA: Insufficient documentation

## 2015-02-24 DIAGNOSIS — I2583 Coronary atherosclerosis due to lipid rich plaque: Secondary | ICD-10-CM | POA: Insufficient documentation

## 2015-02-24 DIAGNOSIS — Z955 Presence of coronary angioplasty implant and graft: Secondary | ICD-10-CM | POA: Insufficient documentation

## 2015-02-24 DIAGNOSIS — Z7982 Long term (current) use of aspirin: Secondary | ICD-10-CM | POA: Insufficient documentation

## 2015-02-24 DIAGNOSIS — I2510 Atherosclerotic heart disease of native coronary artery without angina pectoris: Secondary | ICD-10-CM | POA: Insufficient documentation

## 2015-02-24 NOTE — Progress Notes (Signed)
Cardiac Rehab Medication Review by a Pharmacist  Does the patient  feel that his/her medications are working for him/her?  yes  Has the patient been experiencing any side effects to the medications prescribed?  No. BP runs low but has always had dizziness due to Meniere's Disease. Had cough, cardiologist stopped her lisinopril.  Does the patient measure his/her own blood pressure or blood glucose at home?  Yes, daily   Does the patient have any problems obtaining medications due to transportation or finances?   yes  Understanding of regimen: good Understanding of indications: good Potential of compliance: good    Pharmacist comments: Follows medication list and is compliant with all meds.    Norma Bell, PharmD Clinical Pharmacist Pager # [phone] 02/24/2015 8:31 AM

## 2015-02-28 ENCOUNTER — Encounter (HOSPITAL_COMMUNITY)
Admission: RE | Admit: 2015-02-28 | Discharge: 2015-02-28 | Disposition: A | Discharge status: Home / Self Care | Payer: 59 | Source: Ambulatory Visit | Attending: Cardiovascular Disease | Admitting: Cardiovascular Disease

## 2015-02-28 DIAGNOSIS — I2583 Coronary atherosclerosis due to lipid rich plaque: Secondary | ICD-10-CM | POA: Diagnosis not present

## 2015-02-28 DIAGNOSIS — Z79899 Other long term (current) drug therapy: Secondary | ICD-10-CM | POA: Diagnosis not present

## 2015-02-28 DIAGNOSIS — Z48812 Encounter for surgical aftercare following surgery on the circulatory system: Secondary | ICD-10-CM | POA: Diagnosis not present

## 2015-02-28 DIAGNOSIS — I2510 Atherosclerotic heart disease of native coronary artery without angina pectoris: Secondary | ICD-10-CM | POA: Diagnosis not present

## 2015-02-28 DIAGNOSIS — Z7982 Long term (current) use of aspirin: Secondary | ICD-10-CM | POA: Diagnosis not present

## 2015-02-28 DIAGNOSIS — Z955 Presence of coronary angioplasty implant and graft: Secondary | ICD-10-CM | POA: Diagnosis not present

## 2015-02-28 DIAGNOSIS — E785 Hyperlipidemia, unspecified: Secondary | ICD-10-CM | POA: Diagnosis not present

## 2015-02-28 NOTE — Progress Notes (Signed)
Pt started exercise today in the cardiac rehab phase II program.  Pt verbalized that she is able to hear and understand rehab staff when they are positioned directly in front of her face.    Pt tolerated light exercise without difficulty however pt did express general fatigue due to limited activity and exercise prior to today. Encouraged pt to remain active and exercise with walking on a level surface on her off days from rehab.  Pt verbalized that she would try.  VSS, telemetry showed SR with no ectopy no noted ectopy.  Medication list reconciled.  Pt verbalized compliance with medications and denies barriers to compliance. PSYCHOSOCIAL ASSESSMENT:  PHQ-1. Pt exhibits positive coping skills, hopeful outlook with supportive family includes her husband who accompanied pt to orientation and is here today. Pt and husband have had issues in the past. On two occasions pt's husband moved out of the family home but returned a couple of weeks later.  Pt feels they are on the road to reconciliation and efforts are being made from both to work on marital issues.  No psychosocial needs identified at this time, no psychosocial interventions necessary. Will periodically check back in with pt to assess overall mental well being.  Pt enjoys working outside in the yard and listening to live music.   Pt cardiac rehab  goal is for heart healing and remain healthy for her son.  Pt desires to learn more about how to care for herself to live a long healthy life.  Pt encouraged to participate in home exercise consistently, attend education classes and nutrition class will increase ability to achieve these goals.   Pt long term cardiac rehab goal is lose weight. Pt would like to weigh around 145-150 pounds.  This is a weight her doctor suggested on her last office visit.  Will monitor MET level progression and reported weight. Continue to monitor pt progress toward achieving the goals. Pt oriented to exercise equipment and routine.   Understanding verbalized. Cherre Huger, BSN

## 2015-03-02 ENCOUNTER — Encounter (HOSPITAL_COMMUNITY)
Admission: RE | Admit: 2015-03-02 | Discharge: 2015-03-02 | Disposition: A | Discharge status: Home / Self Care | Payer: 59 | Source: Ambulatory Visit | Attending: Cardiovascular Disease | Admitting: Cardiovascular Disease

## 2015-03-02 DIAGNOSIS — Z48812 Encounter for surgical aftercare following surgery on the circulatory system: Secondary | ICD-10-CM | POA: Diagnosis not present

## 2015-03-03 ENCOUNTER — Other Ambulatory Visit: Payer: Self-pay | Admitting: Physician Assistant

## 2015-03-04 ENCOUNTER — Encounter (HOSPITAL_COMMUNITY)
Admission: RE | Admit: 2015-03-04 | Discharge: 2015-03-04 | Disposition: A | Discharge status: Home / Self Care | Payer: 59 | Source: Ambulatory Visit | Attending: Cardiovascular Disease | Admitting: Cardiovascular Disease

## 2015-03-04 DIAGNOSIS — Z48812 Encounter for surgical aftercare following surgery on the circulatory system: Secondary | ICD-10-CM | POA: Diagnosis not present

## 2015-03-04 NOTE — Telephone Encounter (Signed)
Rx(s) sent to pharmacy electronically.  

## 2015-03-07 ENCOUNTER — Encounter (HOSPITAL_COMMUNITY): Payer: 59

## 2015-03-07 ENCOUNTER — Encounter (HOSPITAL_COMMUNITY)
Admission: RE | Admit: 2015-03-07 | Discharge: 2015-03-07 | Disposition: A | Discharge status: Home / Self Care | Payer: 59 | Source: Ambulatory Visit | Attending: Cardiovascular Disease | Admitting: Cardiovascular Disease

## 2015-03-07 DIAGNOSIS — Z48812 Encounter for surgical aftercare following surgery on the circulatory system: Secondary | ICD-10-CM | POA: Diagnosis not present

## 2015-03-07 NOTE — Progress Notes (Signed)
Pt completed Quality of Life survey as a participant in Cardiac Rehab. overall pt had satisfactory scores.  Pt has chronic illness of meniere disease which has led to bilateral  deafness.  Pt has cochlear implants.  Pt denies shortness of breath and reports her chronic illness decreases her energy level.  No specific psychosocial needs identified, no interventions necessary at this time. Will continue to monitor.

## 2015-03-09 ENCOUNTER — Encounter (HOSPITAL_COMMUNITY): Payer: 59

## 2015-03-11 ENCOUNTER — Encounter (HOSPITAL_COMMUNITY): Admission: RE | Admit: 2015-03-11 | Payer: 59 | Source: Ambulatory Visit

## 2015-03-11 ENCOUNTER — Encounter (HOSPITAL_COMMUNITY): Payer: 59

## 2015-03-14 ENCOUNTER — Encounter (HOSPITAL_COMMUNITY): Payer: 59

## 2015-03-14 ENCOUNTER — Encounter (HOSPITAL_COMMUNITY)
Admission: RE | Admit: 2015-03-14 | Discharge: 2015-03-14 | Disposition: A | Discharge status: Home / Self Care | Payer: 59 | Source: Ambulatory Visit | Attending: Cardiovascular Disease | Admitting: Cardiovascular Disease

## 2015-03-14 DIAGNOSIS — I2510 Atherosclerotic heart disease of native coronary artery without angina pectoris: Secondary | ICD-10-CM | POA: Insufficient documentation

## 2015-03-14 DIAGNOSIS — E785 Hyperlipidemia, unspecified: Secondary | ICD-10-CM | POA: Diagnosis not present

## 2015-03-14 DIAGNOSIS — Z79899 Other long term (current) drug therapy: Secondary | ICD-10-CM | POA: Diagnosis not present

## 2015-03-14 DIAGNOSIS — Z48812 Encounter for surgical aftercare following surgery on the circulatory system: Secondary | ICD-10-CM | POA: Insufficient documentation

## 2015-03-14 DIAGNOSIS — Z7982 Long term (current) use of aspirin: Secondary | ICD-10-CM | POA: Insufficient documentation

## 2015-03-14 DIAGNOSIS — Z955 Presence of coronary angioplasty implant and graft: Secondary | ICD-10-CM | POA: Diagnosis not present

## 2015-03-14 DIAGNOSIS — I2583 Coronary atherosclerosis due to lipid rich plaque: Secondary | ICD-10-CM | POA: Diagnosis not present

## 2015-03-14 NOTE — Progress Notes (Signed)
Reviewed home exercise with pt today.  Pt plans to walk and bike for exercise,2-3 days in addition to CRPII.  Reviewed THR, pulse, RPE, sign and symptoms, NTG use, and when to call 911 or MD.  Pt voiced understanding.     Ayush [employer]

## 2015-03-16 ENCOUNTER — Encounter (HOSPITAL_COMMUNITY): Payer: 59

## 2015-03-17 ENCOUNTER — Other Ambulatory Visit (INDEPENDENT_AMBULATORY_CARE_PROVIDER_SITE_OTHER): Payer: 59 | Admitting: *Deleted

## 2015-03-17 DIAGNOSIS — E785 Hyperlipidemia, unspecified: Secondary | ICD-10-CM

## 2015-03-17 DIAGNOSIS — I2510 Atherosclerotic heart disease of native coronary artery without angina pectoris: Secondary | ICD-10-CM

## 2015-03-17 DIAGNOSIS — I2583 Coronary atherosclerosis due to lipid rich plaque: Secondary | ICD-10-CM

## 2015-03-17 LAB — HEPATIC FUNCTION PANEL
ALK PHOS: 117 U/L (ref 39–117)
ALT: 33 U/L (ref 0–35)
AST: 34 U/L (ref 0–37)
Albumin: 4 g/dL (ref 3.5–5.2)
BILIRUBIN DIRECT: 0.1 mg/dL (ref 0.0–0.3)
BILIRUBIN TOTAL: 0.7 mg/dL (ref 0.2–1.2)
TOTAL PROTEIN: 7.2 g/dL (ref 6.0–8.3)

## 2015-03-17 LAB — LIPID PANEL
Cholesterol: 131 mg/dL (ref 0–200)
HDL: 36 mg/dL — ABNORMAL LOW (ref >39.00)
LDL CALC: 62 mg/dL (ref 0–99)
NonHDL: 95.08
Total CHOL/HDL Ratio: 4
Triglycerides: 165 mg/dL — ABNORMAL HIGH (ref 0.0–149.0)
VLDL: 33 mg/dL (ref 0.0–40.0)

## 2015-03-18 ENCOUNTER — Encounter (HOSPITAL_COMMUNITY): Payer: 59

## 2015-03-18 ENCOUNTER — Encounter (HOSPITAL_COMMUNITY)
Admission: RE | Admit: 2015-03-18 | Discharge: 2015-03-18 | Disposition: A | Discharge status: Home / Self Care | Payer: 59 | Source: Ambulatory Visit | Attending: Cardiovascular Disease | Admitting: Cardiovascular Disease

## 2015-03-18 DIAGNOSIS — Z48812 Encounter for surgical aftercare following surgery on the circulatory system: Secondary | ICD-10-CM | POA: Diagnosis not present

## 2015-03-21 ENCOUNTER — Encounter (HOSPITAL_COMMUNITY): Payer: 59

## 2015-03-23 ENCOUNTER — Encounter (HOSPITAL_COMMUNITY)
Admission: RE | Admit: 2015-03-23 | Discharge: 2015-03-23 | Disposition: A | Discharge status: Home / Self Care | Payer: 59 | Source: Ambulatory Visit | Attending: Cardiovascular Disease | Admitting: Cardiovascular Disease

## 2015-03-23 ENCOUNTER — Encounter (HOSPITAL_COMMUNITY): Payer: 59

## 2015-03-23 DIAGNOSIS — Z48812 Encounter for surgical aftercare following surgery on the circulatory system: Secondary | ICD-10-CM | POA: Diagnosis not present

## 2015-03-25 ENCOUNTER — Encounter (HOSPITAL_COMMUNITY)
Admission: RE | Admit: 2015-03-25 | Discharge: 2015-03-25 | Disposition: A | Discharge status: Home / Self Care | Payer: 59 | Source: Ambulatory Visit | Attending: Cardiovascular Disease | Admitting: Cardiovascular Disease

## 2015-03-25 ENCOUNTER — Encounter (HOSPITAL_COMMUNITY): Payer: 59

## 2015-03-25 DIAGNOSIS — Z48812 Encounter for surgical aftercare following surgery on the circulatory system: Secondary | ICD-10-CM | POA: Diagnosis not present

## 2015-03-25 NOTE — Progress Notes (Signed)
  30 day Psychosocial followup assessment  Patient psychosocial assessment reveals no barriers to cardiac rehab participation.  Psychosocial areas that are affecting patient's rehab experience include concerns about menieres disease and recovering from recent cardiac event.   Patient  does continue to exhibit positive coping skills to deal with psychosocial concerns.  Patient doesmaking progress towards cardiac rehab goals.  Patient reports health and activity level  improved in the past 30 days as evidenced by patient's reports of ability more activity at home. Pt is participating in home exercise either walking or riding stationary bike.   Patient reports feeling positive about current and projected progress toward cardiac rehab goals. Pt does report mild chest discomfort at rest which improves with activity.  She is encouraged that this symptom is improving.  Pt encouraged to discuss with cardiologist or rehab staff if unimproved, worsens or has pain with exertion.  Pt is currently completing an online accounting degree and is anticipating family relocation to be closer to her husbands work.  Patient's rate of progress towards goals is good .  Plan of action to help patient continue to work towards rehab goals include continued cardiac rehab activities, including home exercise and life style modification education classes.  Will continue to monitor and evaluate progress toward psychosocial goals.   Goals in progress:  Help patient work toward returning to meaningful activities that improve patient's quality of life and are attainable.

## 2015-03-28 ENCOUNTER — Encounter (HOSPITAL_COMMUNITY): Payer: 59

## 2015-03-30 ENCOUNTER — Encounter (HOSPITAL_COMMUNITY): Payer: 59

## 2015-03-30 ENCOUNTER — Encounter (HOSPITAL_COMMUNITY)
Admission: RE | Admit: 2015-03-30 | Discharge: 2015-03-30 | Disposition: A | Discharge status: Home / Self Care | Payer: 59 | Source: Ambulatory Visit | Attending: Cardiovascular Disease | Admitting: Cardiovascular Disease

## 2015-03-30 DIAGNOSIS — Z48812 Encounter for surgical aftercare following surgery on the circulatory system: Secondary | ICD-10-CM | POA: Diagnosis not present

## 2015-04-01 ENCOUNTER — Encounter (HOSPITAL_COMMUNITY): Payer: 59

## 2015-04-01 ENCOUNTER — Encounter (HOSPITAL_COMMUNITY)
Admission: RE | Admit: 2015-04-01 | Discharge: 2015-04-01 | Disposition: A | Discharge status: Home / Self Care | Payer: 59 | Source: Ambulatory Visit | Attending: Cardiovascular Disease | Admitting: Cardiovascular Disease

## 2015-04-01 DIAGNOSIS — Z48812 Encounter for surgical aftercare following surgery on the circulatory system: Secondary | ICD-10-CM | POA: Diagnosis not present

## 2015-04-04 ENCOUNTER — Encounter (HOSPITAL_COMMUNITY): Payer: 59

## 2015-04-06 ENCOUNTER — Encounter (HOSPITAL_COMMUNITY): Payer: 59

## 2015-04-06 ENCOUNTER — Encounter (HOSPITAL_COMMUNITY)
Admission: RE | Admit: 2015-04-06 | Discharge: 2015-04-06 | Disposition: A | Discharge status: Home / Self Care | Payer: 59 | Source: Ambulatory Visit | Attending: Cardiovascular Disease | Admitting: Cardiovascular Disease

## 2015-04-06 DIAGNOSIS — Z48812 Encounter for surgical aftercare following surgery on the circulatory system: Secondary | ICD-10-CM | POA: Diagnosis not present

## 2015-04-08 ENCOUNTER — Encounter (HOSPITAL_COMMUNITY): Payer: 59

## 2015-04-08 ENCOUNTER — Telehealth: Payer: Self-pay | Admitting: Interventional Cardiology

## 2015-04-08 NOTE — Telephone Encounter (Signed)
Husband calling (wife sitting next to him and gave permission to speak to him) stating she has been having pain under (L) breast since 6/21 when had heart attack but yesterday pain more intense going into (L) arm and (L) leg. No SOB, dizziness. States she did not take a NTG. Did not want to go to ER.  Today pain has been off and on but not as intense. Describes pain on scale as a 6-7.  BP yesterday was 104/70; today 125/69 HR 99. Still has not taken a NTG.  Wants to make an appointment to see someone next week.  Suggested she go to ER and again her husband states she doesn't feel like it is that intense.  Advised will speak with Dr. Eldridge Dace and call her back.  Spoke w/Dr. Eldridge Dace who suggests that she take a NTG and an extra Coreg and if pain is not relieved she does need to go to ER.  Both pt and her husband verbalize understanding and she will take NTG and the extra Coreg and go to ER if still has pain.

## 2015-04-08 NOTE — Telephone Encounter (Signed)
Pt c/o of Chest Pain: STAT if CP now or developed within 24 hours  1. Are you having CP right now? Little pain now, not as strong as yesterday 8/25  2. Are you experiencing any other symptoms (ex. SOB, nausea, vomiting, sweating)? no  3. How long have you been experiencing CP? Tender since heart attack (6/21)   4. Is your CP continuous or coming and going? Intensity comes and goes  5. Have you taken Nitroglycerin? no ?

## 2015-04-11 ENCOUNTER — Encounter (HOSPITAL_COMMUNITY): Payer: 59

## 2015-04-11 ENCOUNTER — Encounter (HOSPITAL_COMMUNITY)
Admission: RE | Admit: 2015-04-11 | Discharge: 2015-04-11 | Disposition: A | Discharge status: Home / Self Care | Payer: 59 | Source: Ambulatory Visit | Attending: Cardiovascular Disease | Admitting: Cardiovascular Disease

## 2015-04-11 DIAGNOSIS — Z48812 Encounter for surgical aftercare following surgery on the circulatory system: Secondary | ICD-10-CM | POA: Diagnosis not present

## 2015-04-13 ENCOUNTER — Encounter (HOSPITAL_COMMUNITY): Payer: 59

## 2015-04-15 ENCOUNTER — Telehealth (HOSPITAL_COMMUNITY): Payer: Self-pay | Admitting: *Deleted

## 2015-04-15 ENCOUNTER — Encounter (HOSPITAL_COMMUNITY): Payer: 59

## 2015-04-20 ENCOUNTER — Encounter (HOSPITAL_COMMUNITY)
Admission: RE | Admit: 2015-04-20 | Discharge: 2015-04-20 | Disposition: A | Discharge status: Home / Self Care | Payer: 59 | Source: Ambulatory Visit | Attending: Cardiovascular Disease | Admitting: Cardiovascular Disease

## 2015-04-20 ENCOUNTER — Encounter (HOSPITAL_COMMUNITY): Payer: 59

## 2015-04-20 DIAGNOSIS — I2510 Atherosclerotic heart disease of native coronary artery without angina pectoris: Secondary | ICD-10-CM | POA: Insufficient documentation

## 2015-04-20 DIAGNOSIS — Z955 Presence of coronary angioplasty implant and graft: Secondary | ICD-10-CM | POA: Insufficient documentation

## 2015-04-20 DIAGNOSIS — Z79899 Other long term (current) drug therapy: Secondary | ICD-10-CM | POA: Diagnosis not present

## 2015-04-20 DIAGNOSIS — E785 Hyperlipidemia, unspecified: Secondary | ICD-10-CM | POA: Insufficient documentation

## 2015-04-20 DIAGNOSIS — Z7982 Long term (current) use of aspirin: Secondary | ICD-10-CM | POA: Insufficient documentation

## 2015-04-20 DIAGNOSIS — I2583 Coronary atherosclerosis due to lipid rich plaque: Secondary | ICD-10-CM | POA: Insufficient documentation

## 2015-04-20 DIAGNOSIS — Z48812 Encounter for surgical aftercare following surgery on the circulatory system: Secondary | ICD-10-CM | POA: Insufficient documentation

## 2015-04-20 NOTE — Progress Notes (Signed)
Norma Bell 49 y.o. female Nutrition Note Spoke with pt. Nutrition Plan and Nutrition Survey goals reviewed with pt. Pt is following Step 2 of the Therapeutic Lifestyle Changes diet. Pt is aware of prediabetes diagnosis. Prediabetes discussed. Pt with dx of CHF. Per discussion, pt does not use canned/convenience foods often. Pt eats out infrequently. Pt expressed understanding of the information reviewed. Pt aware of nutrition education classes offered and plans on attending nutrition classes. Lab Results  Component Value Date   HGBA1C 6.3* 02/01/2015   Nutrition Diagnosis ? Food-and nutrition-related knowledge deficit related to lack of exposure to information as related to diagnosis of: ? CVD ? Pre-DM ? Overweight related to excessive energy intake as evidenced by a BMI of 29.2  Nutrition RX/ Estimated Daily Nutrition Needs for: wt loss 1350-1850 Kcal, 35-50 gm fat, 8-14 gm sat fat, 1.3-1.8 gm trans-fat, <1500 mg sodium  Nutrition Intervention ? [name] individual nutrition plan reviewed with pt. ? Benefits of adopting Therapeutic Lifestyle Changes discussed when Medficts reviewed. ? Pt to attend the Portion Distortion class ? Pt to attend the Diabetes Q & A class ? Pt to attend the   ? Nutrition I class                  ? Nutrition II class   ? Pt given handouts for: ? Nutrition I class ? Nutrition II class ? pre-diabetes ? Continue client-centered nutrition education by RD, as part of interdisciplinary care. Goal(s) ? Pt to identify food quantities necessary to achieve: ? wt loss to a goal wt of 166-184 lb (75.7-83.9 kg) at graduation from cardiac rehab.  ? Pt to describe the benefit of including fruits, vegetables, whole grains, and low-fat dairy products in a heart healthy meal plan. ? Pt able to name foods that affect blood glucose  Monitor and Evaluate progress toward nutrition goal with team. Nutrition Risk: Change to Moderate Mickle Plumb, M.Ed, RD, LDN, CDE 04/20/2015 9:54 AM

## 2015-04-21 ENCOUNTER — Other Ambulatory Visit (INDEPENDENT_AMBULATORY_CARE_PROVIDER_SITE_OTHER): Payer: 59

## 2015-04-21 DIAGNOSIS — E785 Hyperlipidemia, unspecified: Secondary | ICD-10-CM

## 2015-04-21 LAB — LIPID PANEL
CHOLESTEROL: 126 mg/dL (ref 0–200)
HDL: 35.6 mg/dL — ABNORMAL LOW (ref >39.00)
LDL CALC: 64 mg/dL (ref 0–99)
NonHDL: 90.1
Total CHOL/HDL Ratio: 4
Triglycerides: 129 mg/dL (ref 0.0–149.0)
VLDL: 25.8 mg/dL (ref 0.0–40.0)

## 2015-04-22 ENCOUNTER — Encounter (HOSPITAL_COMMUNITY)
Admission: RE | Admit: 2015-04-22 | Discharge: 2015-04-22 | Disposition: A | Discharge status: Home / Self Care | Payer: 59 | Source: Ambulatory Visit | Attending: Cardiovascular Disease | Admitting: Cardiovascular Disease

## 2015-04-22 ENCOUNTER — Encounter (HOSPITAL_COMMUNITY): Payer: 59

## 2015-04-22 DIAGNOSIS — Z48812 Encounter for surgical aftercare following surgery on the circulatory system: Secondary | ICD-10-CM | POA: Diagnosis not present

## 2015-04-25 ENCOUNTER — Encounter (HOSPITAL_COMMUNITY): Payer: 59

## 2015-04-27 ENCOUNTER — Encounter (HOSPITAL_COMMUNITY)
Admission: RE | Admit: 2015-04-27 | Discharge: 2015-04-27 | Disposition: A | Discharge status: Home / Self Care | Payer: 59 | Source: Ambulatory Visit | Attending: Cardiovascular Disease | Admitting: Cardiovascular Disease

## 2015-04-27 ENCOUNTER — Encounter (HOSPITAL_COMMUNITY): Payer: 59

## 2015-04-27 DIAGNOSIS — Z48812 Encounter for surgical aftercare following surgery on the circulatory system: Secondary | ICD-10-CM | POA: Diagnosis not present

## 2015-04-27 NOTE — Progress Notes (Signed)
60 day psychosocial assessment:  No psychosocial needs identfied, no interventions necessary.  Pt is exercising on her own at home riding stationary bike and walking her dog.  Pt reports she is able to walk her dog longer distances.  Pt also reports her anxiety related chest pain is reducing.  This pain is occurs at rest and is relieved with activity.  Pt notes duration and frequency of episodes have decreased.  Will continue to monitor

## 2015-04-28 ENCOUNTER — Telehealth: Payer: Self-pay | Admitting: Interventional Cardiology

## 2015-04-28 NOTE — Telephone Encounter (Signed)
Informed of normal labs and verbal understanding expressed.   Reminded patient that results have been sent to MyChart for review.

## 2015-04-28 NOTE — Telephone Encounter (Signed)
New message     Pt husband calling in regards to pt lab results

## 2015-04-29 ENCOUNTER — Encounter (HOSPITAL_COMMUNITY): Payer: 59

## 2015-05-02 ENCOUNTER — Encounter (HOSPITAL_COMMUNITY): Payer: 59

## 2015-05-04 ENCOUNTER — Telehealth (HOSPITAL_COMMUNITY): Payer: Self-pay | Admitting: *Deleted

## 2015-05-04 ENCOUNTER — Encounter (HOSPITAL_COMMUNITY): Payer: 59

## 2015-05-04 ENCOUNTER — Encounter (HOSPITAL_COMMUNITY): Admission: RE | Admit: 2015-05-04 | Payer: 59 | Source: Ambulatory Visit

## 2015-05-06 ENCOUNTER — Encounter (HOSPITAL_COMMUNITY): Payer: 59

## 2015-05-09 ENCOUNTER — Encounter (HOSPITAL_COMMUNITY): Payer: 59

## 2015-05-11 ENCOUNTER — Encounter (HOSPITAL_COMMUNITY): Payer: 59

## 2015-05-11 ENCOUNTER — Encounter (HOSPITAL_COMMUNITY)
Admission: RE | Admit: 2015-05-11 | Discharge: 2015-05-11 | Disposition: A | Discharge status: Home / Self Care | Payer: 59 | Source: Ambulatory Visit | Attending: Cardiovascular Disease | Admitting: Cardiovascular Disease

## 2015-05-11 DIAGNOSIS — Z48812 Encounter for surgical aftercare following surgery on the circulatory system: Secondary | ICD-10-CM | POA: Diagnosis not present

## 2015-05-11 NOTE — Progress Notes (Signed)
Pt returned to cardiac rehab today after absence due to gasoline shortage.  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 Pt also reports her husband will be away next for work travel therefore she will be unable to attend cardiac rehab for next several visits. Pt is unable to drive herself and is too far away for public transportation.  Pt also verbalizes concern about possible cervical cauterization.  She is awaiting results from GYN if this is necessary.  Although pt reports several stressors she demonstrates effective coping skills.  offered emotional support and reassurance. Pt tolerated exercise without difficulty and reports she has been exercising at home.  Will  Continue to monitor.

## 2015-05-13 ENCOUNTER — Encounter (HOSPITAL_COMMUNITY): Payer: 59

## 2015-05-16 ENCOUNTER — Encounter (HOSPITAL_COMMUNITY): Payer: Commercial Managed Care - HMO

## 2015-05-18 ENCOUNTER — Encounter (HOSPITAL_COMMUNITY): Payer: Commercial Managed Care - HMO

## 2015-05-20 ENCOUNTER — Encounter (HOSPITAL_COMMUNITY)
Admission: RE | Admit: 2015-05-20 | Discharge: 2015-05-20 | Disposition: A | Discharge status: Home / Self Care | Payer: Commercial Managed Care - HMO | Source: Ambulatory Visit | Attending: Cardiovascular Disease | Admitting: Cardiovascular Disease

## 2015-05-20 ENCOUNTER — Encounter (HOSPITAL_COMMUNITY): Payer: Commercial Managed Care - HMO

## 2015-05-20 DIAGNOSIS — E785 Hyperlipidemia, unspecified: Secondary | ICD-10-CM | POA: Insufficient documentation

## 2015-05-20 DIAGNOSIS — I2583 Coronary atherosclerosis due to lipid rich plaque: Secondary | ICD-10-CM | POA: Diagnosis not present

## 2015-05-20 DIAGNOSIS — Z7982 Long term (current) use of aspirin: Secondary | ICD-10-CM | POA: Diagnosis not present

## 2015-05-20 DIAGNOSIS — I2510 Atherosclerotic heart disease of native coronary artery without angina pectoris: Secondary | ICD-10-CM | POA: Diagnosis not present

## 2015-05-20 DIAGNOSIS — Z48812 Encounter for surgical aftercare following surgery on the circulatory system: Secondary | ICD-10-CM | POA: Diagnosis present

## 2015-05-20 DIAGNOSIS — Z955 Presence of coronary angioplasty implant and graft: Secondary | ICD-10-CM | POA: Diagnosis not present

## 2015-05-20 DIAGNOSIS — Z79899 Other long term (current) drug therapy: Secondary | ICD-10-CM | POA: Insufficient documentation

## 2015-05-23 ENCOUNTER — Encounter (HOSPITAL_COMMUNITY): Payer: Commercial Managed Care - HMO

## 2015-05-25 ENCOUNTER — Encounter (HOSPITAL_COMMUNITY)
Admission: RE | Admit: 2015-05-25 | Discharge: 2015-05-25 | Disposition: A | Discharge status: Home / Self Care | Payer: Commercial Managed Care - HMO | Source: Ambulatory Visit | Attending: Cardiovascular Disease | Admitting: Cardiovascular Disease

## 2015-05-25 ENCOUNTER — Encounter (HOSPITAL_COMMUNITY): Payer: Commercial Managed Care - HMO

## 2015-05-25 DIAGNOSIS — Z48812 Encounter for surgical aftercare following surgery on the circulatory system: Secondary | ICD-10-CM | POA: Diagnosis not present

## 2015-05-25 NOTE — Progress Notes (Signed)
90 day psychosocial assessment  No psychosocial needs identified, no intervention necessary. Pt is exercising at home inconsistently.  Pt does not drive therefore depends on others for transportation, this does interfere with her ability to have regular attendance at cardiac rehab.  Pt also has weather related complications from menieres disease, which also results in absences.  Pt encouraged to exercise on days she is away from cardiac rehab.  Understanding verbalized.

## 2015-05-27 ENCOUNTER — Encounter (HOSPITAL_COMMUNITY): Payer: Commercial Managed Care - HMO

## 2015-05-27 ENCOUNTER — Encounter (HOSPITAL_COMMUNITY)
Admission: RE | Admit: 2015-05-27 | Discharge: 2015-05-27 | Disposition: A | Discharge status: Home / Self Care | Payer: Commercial Managed Care - HMO | Source: Ambulatory Visit | Attending: Cardiovascular Disease | Admitting: Cardiovascular Disease

## 2015-05-27 DIAGNOSIS — Z48812 Encounter for surgical aftercare following surgery on the circulatory system: Secondary | ICD-10-CM | POA: Diagnosis not present

## 2015-05-30 ENCOUNTER — Encounter (HOSPITAL_COMMUNITY): Payer: Commercial Managed Care - HMO

## 2015-06-01 ENCOUNTER — Encounter (HOSPITAL_COMMUNITY): Payer: Commercial Managed Care - HMO

## 2015-06-02 ENCOUNTER — Encounter: Payer: Self-pay | Admitting: Interventional Cardiology

## 2015-06-02 ENCOUNTER — Ambulatory Visit (INDEPENDENT_AMBULATORY_CARE_PROVIDER_SITE_OTHER): Payer: 59 | Admitting: Interventional Cardiology

## 2015-06-02 VITALS — BP 88/60 | HR 78 | Ht 68.0 in | Wt 193.4 lb

## 2015-06-02 DIAGNOSIS — I252 Old myocardial infarction: Secondary | ICD-10-CM | POA: Insufficient documentation

## 2015-06-02 DIAGNOSIS — I2583 Coronary atherosclerosis due to lipid rich plaque: Principal | ICD-10-CM

## 2015-06-02 DIAGNOSIS — I2510 Atherosclerotic heart disease of native coronary artery without angina pectoris: Secondary | ICD-10-CM

## 2015-06-02 DIAGNOSIS — E785 Hyperlipidemia, unspecified: Secondary | ICD-10-CM | POA: Diagnosis not present

## 2015-06-02 NOTE — Progress Notes (Signed)
Patient ID: Norma Bell, female   DOB: 1966-04-20, 49 y.o.   MRN: [id number]     Cardiology Office Note   Date:  06/02/2015   ID:  Norma Bell, DOB [birth year], MRN [id number]  PCP:  Dow Adolph, MD    Chief Complaint  Patient presents with  . Coronary Artery Disease   F/u CAD  Wt Readings from Last 3 Encounters:  06/02/15 193 lb 6.4 oz (87.726 kg)  02/24/15 190 lb 14.7 oz (86.6 kg)  02/16/15 185 lb 12.8 oz (84.278 kg)       History of Present Illness: Norma Bell is a 49 y.o. female  Who had an anterior MI treated with DES placement in 6/16.  She has done well.  She saw a PA in 7/16 and had been doing well.   Lisinopril was stopped due to low BP and cough at the last visit.    She had some mild orthostatic sx at that time as well.  BP readings are low at times. BP has been ok at rehab.  No lightheadedness or syncope.   She was told to weight herself daily.  EF was in the 45-50% range.  No recent signs of fluid overload.  No edema.     No bleeding problems.  No GI bleeding.  Menstrual periods have been heavier.    Limited by Meniere's disease.    Past Medical History  Diagnosis Date  . Allergy   . Meniere disease   . HOH (hard of hearing)     Bilateral cochlear implants  . Hyperlipidemia LDL goal <70     Past Surgical History  Procedure Laterality Date  . Appendectomy    . Partial hip arthroplasty Left   . Cochlear implant    . Stunts in both ears    . Tubal ligation    . Joint replacement    . Cochlear implant left ear    . Cardiac catheterization N/A 02/01/2015    Procedure: Left Heart Cath and Coronary Angiography;  Surgeon: Corky Crafts, MD;  Location: [facility] INVASIVE CV LAB;  Service: Cardiovascular;  Laterality: N/A;  . Cardiac catheterization  02/01/2015    Procedure: Coronary Stent Intervention;  Surgeon: Corky Crafts, MD;  Location: [facility] INVASIVE CV LAB;  Service: Cardiovascular;;     Current Outpatient Prescriptions  Medication  Sig Dispense Refill  . albuterol (PROVENTIL HFA;VENTOLIN HFA) 108 (90 BASE) MCG/ACT inhaler Inhale 2 puffs into the lungs every 6 (six) hours as needed for wheezing. 1 Inhaler 2  . amitriptyline (ELAVIL) 75 MG tablet Take 75 mg by mouth at bedtime.    Marland Kitchen aspirin 81 MG chewable tablet Chew 1 tablet (81 mg total) by mouth daily.    Marland Kitchen atorvastatin (LIPITOR) 80 MG tablet Take 1 tablet (80 mg total) by mouth daily at 6 PM. 30 tablet 11  . BRILINTA 90 MG TABS tablet TAKE 1 TABLET BY MOUTH TWICE A DAY 60 tablet 5  . carvedilol (COREG) 3.125 MG tablet Take 1 tablet (3.125 mg total) by mouth 2 (two) times daily with a meal. 60 tablet 11  . nitroGLYCERIN (NITROSTAT) 0.4 MG SL tablet Place 1 tablet (0.4 mg total) under the tongue every 5 (five) minutes x 3 doses as needed for chest pain. 25 tablet 6   No current facility-administered medications for this visit.    Allergies:   Sucralfate; Ranitidine; Prilosec; and Other    Social History:  The patient  reports that she has never smoked. She does not  have any smokeless tobacco history on file. She reports that she does not drink alcohol or use illicit drugs.   Family History:  The patient's family history includes Hypertension in her father and mother.    ROS:  Please see the history of present illness.   Otherwise, review of systems are positive for occasional pain in the chest when she stops exercising.  Does not feel like MI pain.  It does not happen with exercise.  It gets better with exercise.   All other systems are reviewed and negative.    PHYSICAL EXAM: VS:  BP 88/60 mmHg  Pulse 78  Ht 5\' 8"  (1.727 m)  Wt 193 lb 6.4 oz (87.726 kg)  BMI 29.41 kg/m2 , BMI Body mass index is 29.41 kg/(m^2). GEN: Well nourished, well developed, in no acute distress HEENT: normal Neck: no JVD, carotid bruits, or masses Cardiac: RRR; no murmurs, rubs, or gallops,no edema  Respiratory:  clear to auscultation bilaterally, normal work of breathing GI: soft,  nontender, nondistended, + BS MS: no deformity or atrophy Skin: warm and dry, no rash Neuro:  Strength and sensation are intact Psych: euthymic mood, full affect     Recent Labs: 02/01/2015: TSH 2.332 02/02/2015: B Natriuretic Peptide 299.3*; Hemoglobin 12.0; Magnesium 1.7; Platelets 377 02/04/2015: BUN 13; Creatinine, Ser 1.00; Potassium 3.5; Sodium 137 03/17/2015: ALT 33   Lipid Panel    Component Value Date/Time   CHOL 126 04/21/2015 1058   TRIG 129.0 04/21/2015 1058   HDL 35.60* 04/21/2015 1058   CHOLHDL 4 04/21/2015 1058   VLDL 25.8 04/21/2015 1058   LDLCALC 64 04/21/2015 1058   LDLDIRECT 125* 04/08/2012 1513     Other studies Reviewed: Additional studies/ records that were reviewed today with results demonstrating: Cath report showed DES to the LAD.Marland Kitchen.   ASSESSMENT AND PLAN:  1. CAD/Old MI: No typical angina.  Chest pain is atypical.  Nonexertional.  COntinue DAPT.  2. Hyperlipidemia:  LDL improved.  Continue high dose atorvastatin. 3. No signs of CHF.   Current medicines are reviewed at length with the patient today.  The patient concerns regarding her medicines were addressed.  The following changes have been made:  No change  Labs/ tests ordered today include:  No orders of the defined types were placed in this encounter.    Recommend 150 minutes/week of aerobic exercise Low fat, low carb, high fiber diet recommended  Disposition:   FU in 6/17.   Delorise JacksonSigned, Babe Clenney S., MD  06/02/2015 10:24 AM    [facility] [address], [place], [place]  [address] Phone: [phone]; Fax: [phone]

## 2015-06-02 NOTE — Patient Instructions (Signed)
**Note De-Identified Norma Bell Obfuscation** Medication Instructions:  Same-no changes  Labwork: None  Testing/Procedures: None  Follow-Up: Your physician wants you to follow-up in: June 2017. You will receive a reminder letter in the mail two months in advance. If you don't receive a letter, please call our office to schedule the follow-up appointment.

## 2015-06-03 ENCOUNTER — Encounter (HOSPITAL_COMMUNITY): Payer: 59

## 2015-06-03 ENCOUNTER — Encounter (HOSPITAL_COMMUNITY): Payer: Commercial Managed Care - HMO

## 2015-06-06 ENCOUNTER — Encounter (HOSPITAL_COMMUNITY): Payer: Commercial Managed Care - HMO

## 2015-06-08 ENCOUNTER — Encounter (HOSPITAL_COMMUNITY): Payer: Commercial Managed Care - HMO

## 2015-06-08 ENCOUNTER — Encounter (HOSPITAL_COMMUNITY)
Admission: RE | Admit: 2015-06-08 | Discharge: 2015-06-08 | Disposition: A | Discharge status: Home / Self Care | Payer: Commercial Managed Care - HMO | Source: Ambulatory Visit | Attending: Cardiovascular Disease | Admitting: Cardiovascular Disease

## 2015-06-08 DIAGNOSIS — Z48812 Encounter for surgical aftercare following surgery on the circulatory system: Secondary | ICD-10-CM | POA: Diagnosis not present

## 2015-06-10 ENCOUNTER — Encounter (HOSPITAL_COMMUNITY): Payer: Commercial Managed Care - HMO

## 2015-06-10 ENCOUNTER — Encounter (HOSPITAL_COMMUNITY)
Admission: RE | Admit: 2015-06-10 | Discharge: 2015-06-10 | Disposition: A | Discharge status: Home / Self Care | Payer: Commercial Managed Care - HMO | Source: Ambulatory Visit | Attending: Cardiovascular Disease | Admitting: Cardiovascular Disease

## 2015-06-10 DIAGNOSIS — Z48812 Encounter for surgical aftercare following surgery on the circulatory system: Secondary | ICD-10-CM | POA: Diagnosis not present

## 2015-06-13 ENCOUNTER — Encounter (HOSPITAL_COMMUNITY): Payer: Commercial Managed Care - HMO

## 2015-06-15 ENCOUNTER — Encounter (HOSPITAL_COMMUNITY)
Admission: RE | Admit: 2015-06-15 | Discharge: 2015-06-15 | Disposition: A | Discharge status: Home / Self Care | Payer: 59 | Source: Ambulatory Visit | Attending: Cardiovascular Disease | Admitting: Cardiovascular Disease

## 2015-06-15 DIAGNOSIS — Z955 Presence of coronary angioplasty implant and graft: Secondary | ICD-10-CM | POA: Insufficient documentation

## 2015-06-15 DIAGNOSIS — I2510 Atherosclerotic heart disease of native coronary artery without angina pectoris: Secondary | ICD-10-CM | POA: Insufficient documentation

## 2015-06-15 DIAGNOSIS — I2583 Coronary atherosclerosis due to lipid rich plaque: Secondary | ICD-10-CM | POA: Insufficient documentation

## 2015-06-15 DIAGNOSIS — Z48812 Encounter for surgical aftercare following surgery on the circulatory system: Secondary | ICD-10-CM | POA: Insufficient documentation

## 2015-06-15 DIAGNOSIS — Z79899 Other long term (current) drug therapy: Secondary | ICD-10-CM | POA: Insufficient documentation

## 2015-06-15 DIAGNOSIS — E785 Hyperlipidemia, unspecified: Secondary | ICD-10-CM | POA: Insufficient documentation

## 2015-06-15 DIAGNOSIS — Z7982 Long term (current) use of aspirin: Secondary | ICD-10-CM | POA: Insufficient documentation

## 2015-06-17 ENCOUNTER — Encounter (HOSPITAL_COMMUNITY)
Admission: RE | Admit: 2015-06-17 | Discharge: 2015-06-17 | Disposition: A | Discharge status: Home / Self Care | Payer: 59 | Source: Ambulatory Visit | Attending: Cardiovascular Disease | Admitting: Cardiovascular Disease

## 2015-06-20 ENCOUNTER — Encounter (HOSPITAL_COMMUNITY)
Admission: RE | Admit: 2015-06-20 | Discharge: 2015-06-20 | Disposition: A | Discharge status: Home / Self Care | Payer: 59 | Source: Ambulatory Visit | Attending: Cardiovascular Disease | Admitting: Cardiovascular Disease

## 2015-06-22 ENCOUNTER — Encounter (HOSPITAL_COMMUNITY)
Admission: RE | Admit: 2015-06-22 | Discharge: 2015-06-22 | Disposition: A | Discharge status: Home / Self Care | Payer: 59 | Source: Ambulatory Visit | Attending: Cardiovascular Disease | Admitting: Cardiovascular Disease

## 2015-06-24 ENCOUNTER — Encounter (HOSPITAL_COMMUNITY): Payer: Self-pay

## 2015-06-24 ENCOUNTER — Encounter (HOSPITAL_COMMUNITY)
Admission: RE | Admit: 2015-06-24 | Discharge: 2015-06-24 | Disposition: A | Discharge status: Home / Self Care | Payer: 59 | Source: Ambulatory Visit | Attending: Cardiovascular Disease | Admitting: Cardiovascular Disease

## 2015-06-24 NOTE — Progress Notes (Signed)
Pt graduated from cardiac rehab program today with completion of 26 exercise sessions in Phase II. Pt maintained average attendance with frequent absences due to illness, transportation and travel.  However,  progressed nicely during her participation in rehab as evidenced by increased MET level.   Medication list reconciled. Repeat  PHQ score- 1.  Pt has chronic depressive tendencies due to her meniere's disease which she was diagnosed at age 49.  Although pt has chronic condition, pt manages well and demonstrates positive attitude with hopeful outlook. 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  She has almost completed the program with only a few courses remaining.  Pt feels she has achieved her goals during cardiac rehab which include improved energy and stamina. Pt has been able to return to bowling which is a passion of hers.  Pt has also maintained her weight for which she has been congratulation.    Pt plans to continue exercising on her own at home.

## 2015-06-27 ENCOUNTER — Encounter (HOSPITAL_COMMUNITY): Payer: 59

## 2015-07-19 ENCOUNTER — Encounter: Payer: Self-pay | Admitting: Cardiovascular Disease

## 2015-12-20 ENCOUNTER — Telehealth: Payer: Self-pay | Admitting: *Deleted

## 2015-12-20 NOTE — Telephone Encounter (Signed)
Patients husband, Thereasa DistanceRodney called and stated that the pharmacy suggested that the patient try clopidogrel to replace the brilinta, due to cost. He can be reached at [phone]. Please advise. Thanks, MI

## 2015-12-21 NOTE — Telephone Encounter (Addendum)
**Note De-Identified Norma Bell Obfuscation** The pts husband states that the pt is almost out of Brilinta and that they cannot afford her refill as Brilinta is to expensive. He is advised that I am sending this message to Dr Eldridge DaceVaranasi for his recommendation. I have advised the pt's husband that we have Brilinta samples here at the office that she can take until we get answer from Dr Eldridge DaceVaranasi. He states that he or his son will come by the office today or tomorrow to pick them up. I expressed the importance of the pt taking her antiplatelet inhibitor without interuption daily. He verbalized understanding.  Please advise.

## 2015-12-21 NOTE — Telephone Encounter (Signed)
LMTCB

## 2015-12-21 NOTE — Telephone Encounter (Signed)
Follow-up       Per husband of the pt is returning a phone call regarding medication. Please call back.

## 2015-12-22 NOTE — Telephone Encounter (Signed)
OK to switch to clopidogrel 75 mg daily.  On first and second day  Of taking clopidogrel, she should take 2 tablets each day, then 1 tab thereafter.

## 2015-12-23 NOTE — Telephone Encounter (Signed)
**Note De-Identified Emmalise Huard Obfuscation** Unable to reach the pts husband at number listed. Will continue to call.  The pt was given a months supply of Brilinta on 12/21/15.

## 2015-12-26 NOTE — Telephone Encounter (Signed)
**Note De-Identified Chetan Mehring Obfuscation** The pts husband, Norma Bell, is advised per Dr Eldridge DaceVaranasi the pt can stop taking Brilinta and start taking Clopidogrel 75 mg daily. He questioned if the pt can finish taking the supple of Brilinta before starting Clopidogrel and I advised him that she can. Also, he states that they have moved and are using a different pharmacy at this time. He states that he will call back with their new pharmacy information so I can send the pts RX in.

## 2015-12-27 ENCOUNTER — Telehealth: Payer: Self-pay | Admitting: Interventional Cardiology

## 2015-12-27 MED ORDER — CLOPIDOGREL BISULFATE 75 MG PO TABS: 75.0000 mg | ORAL_TABLET | Freq: Every day | ORAL | Status: DC

## 2015-12-27 NOTE — Telephone Encounter (Signed)
Plavix 75 mg daily has been called in and E[facility]scribed to [facility] [place], [place].

## 2015-12-27 NOTE — Telephone Encounter (Signed)
Follow-up      The husband, was calling with new pharmacy name [facility] [phone]. So the prescriptions can be called in.

## 2016-01-30 ENCOUNTER — Other Ambulatory Visit: Payer: Self-pay | Admitting: Interventional Cardiology

## 2016-01-31 ENCOUNTER — Other Ambulatory Visit: Payer: Self-pay

## 2016-01-31 MED ORDER — ATORVASTATIN CALCIUM 80 MG PO TABS: 80.0000 mg | ORAL_TABLET | Freq: Every day | ORAL | Status: DC

## 2016-02-14 ENCOUNTER — Other Ambulatory Visit: Payer: Self-pay | Admitting: Interventional Cardiology

## 2016-03-05 ENCOUNTER — Other Ambulatory Visit: Payer: Self-pay | Admitting: Interventional Cardiology

## 2016-06-07 ENCOUNTER — Other Ambulatory Visit: Payer: Self-pay | Admitting: Interventional Cardiology

## 2016-06-19 ENCOUNTER — Other Ambulatory Visit: Payer: Self-pay | Admitting: Interventional Cardiology

## 2016-06-22 ENCOUNTER — Other Ambulatory Visit: Payer: Self-pay | Admitting: Interventional Cardiology

## 2016-07-06 ENCOUNTER — Other Ambulatory Visit: Payer: Self-pay | Admitting: Interventional Cardiology

## 2016-07-19 ENCOUNTER — Other Ambulatory Visit: Payer: Self-pay | Admitting: Interventional Cardiology

## 2016-07-27 ENCOUNTER — Telehealth: Payer: Self-pay | Admitting: Interventional Cardiology

## 2016-07-27 MED ORDER — ATORVASTATIN CALCIUM 80 MG PO TABS: 80.0000 mg | ORAL_TABLET | Freq: Every day | ORAL | 0 refills | Status: DC

## 2016-07-27 NOTE — Telephone Encounter (Signed)
New message  1. Atorvastatin 80mg  1 tab daily 2. [facility] [phone] 3. 30 day supply

## 2016-08-01 ENCOUNTER — Encounter: Payer: Self-pay | Admitting: Interventional Cardiology

## 2016-08-01 ENCOUNTER — Ambulatory Visit (INDEPENDENT_AMBULATORY_CARE_PROVIDER_SITE_OTHER): Payer: Self-pay | Admitting: Interventional Cardiology

## 2016-08-01 VITALS — BP 104/68 | HR 71 | Ht 68.0 in | Wt 207.8 lb

## 2016-08-01 DIAGNOSIS — I2583 Coronary atherosclerosis due to lipid rich plaque: Secondary | ICD-10-CM

## 2016-08-01 DIAGNOSIS — I252 Old myocardial infarction: Secondary | ICD-10-CM

## 2016-08-01 DIAGNOSIS — I2510 Atherosclerotic heart disease of native coronary artery without angina pectoris: Secondary | ICD-10-CM

## 2016-08-01 DIAGNOSIS — R0782 Intercostal pain: Secondary | ICD-10-CM

## 2016-08-01 DIAGNOSIS — E785 Hyperlipidemia, unspecified: Secondary | ICD-10-CM

## 2016-08-01 MED ORDER — ATORVASTATIN CALCIUM 80 MG PO TABS: 80.0000 mg | ORAL_TABLET | Freq: Every day | ORAL | 11 refills | Status: DC

## 2016-08-01 MED ORDER — CLOPIDOGREL BISULFATE 75 MG PO TABS: 75.0000 mg | ORAL_TABLET | Freq: Every day | ORAL | 6 refills | Status: DC

## 2016-08-01 MED ORDER — NITROGLYCERIN 0.4 MG SL SUBL: 0.4000 mg | SUBLINGUAL_TABLET | SUBLINGUAL | 6 refills | Status: DC | PRN

## 2016-08-01 MED ORDER — CARVEDILOL 3.125 MG PO TABS: ORAL_TABLET | ORAL | 11 refills | Status: DC

## 2016-08-01 NOTE — Progress Notes (Signed)
Patient ID: Norma Bell, female   DOB: [birth year], 50 y.o.   MRN: [id number]     Cardiology Office Note   Date:  08/01/2016   ID:  Norma Bell, DOB [birth year], MRN [id number]  PCP:  Dow AdolphMCPHERSON,BARBARA, MD    No chief complaint on file.  F/u CAD  Wt Readings from Last 3 Encounters:  08/01/16 207 lb 12.8 oz (94.3 kg)  06/02/15 193 lb 6.4 oz (87.7 kg)  02/24/15 190 lb 14.7 oz (86.6 kg)       History of Present Illness: Norma Bell is a 50 y.o. female  Who had an anterior MI treated with DES placement in 6/16.  She has done well.   Lisinopril was stopped due to low BP and cough in the past.     She was told to weight herself daily.  EF was in the 45-50% range.  No recent signs of fluid overload.  No edema.     No bleeding problems.  No GI bleeding.  Now off Plavix.  Tolerating aspirin.   Limited by Meniere's disease.  Has hearing aids.    Moved to [place], [place].  Will still f/u here in [place] for cardiology.  Mild left rib pain with deep breathing.  No pain like angina or MI.    Past Medical History:  Diagnosis Date  . Allergy   . HOH (hard of hearing)    Bilateral cochlear implants  . Hyperlipidemia LDL goal <70   . Meniere disease     Past Surgical History:  Procedure Laterality Date  . APPENDECTOMY    . CARDIAC CATHETERIZATION N/A 02/01/2015   Procedure: Left Heart Cath and Coronary Angiography;  Surgeon: Corky CraftsJayadeep S Aunica Dauphinee, MD;  [facility];  Service: Cardiovascular;  Laterality: N/A;  . CARDIAC CATHETERIZATION  02/01/2015   Procedure: Coronary Stent Intervention;  Surgeon: Corky CraftsJayadeep S Ahmeer Tuman, MD;  [facility];  Service: Cardiovascular;;  . COCHLEAR IMPLANT    . cochlear implant left ear    . JOINT REPLACEMENT    . PARTIAL HIP ARTHROPLASTY Left   . stunts in both ears    . TUBAL LIGATION       Current Outpatient Prescriptions  Medication Sig Dispense Refill  . albuterol (PROVENTIL HFA;VENTOLIN HFA) 108 (90 BASE) MCG/ACT  inhaler Inhale 2 puffs into the lungs every 6 (six) hours as needed for wheezing. 1 Inhaler 2  . amitriptyline (ELAVIL) 75 MG tablet Take 75 mg by mouth at bedtime.    Marland Kitchen. aspirin 81 MG chewable tablet Chew 1 tablet (81 mg total) by mouth daily.    Marland Kitchen. atorvastatin (LIPITOR) 80 MG tablet Take 1 tablet (80 mg total) by mouth daily at 6 PM. 30 tablet 0  . carvedilol (COREG) 3.125 MG tablet Take 1 tablet by mouth twice daily with food. OVERDUE FOR F/U. Call [phone] AND SCHEDULE FOR FURTHER REFILLS - 2nd attempt 60 tablet 0  . clopidogrel (PLAVIX) 75 MG tablet Take 1 tablet (75 mg total) by mouth daily. 30 tablet 6  . nitroGLYCERIN (NITROSTAT) 0.4 MG SL tablet Place 1 tablet (0.4 mg total) under the tongue every 5 (five) minutes x 3 doses as needed for chest pain. 25 tablet 6   No current facility-administered medications for this visit.     Allergies:   Sucralfate; Ranitidine; Prilosec [omeprazole]; and Other    Social History:  The patient  reports that she has never smoked. She has never used smokeless tobacco. She reports that she does  not drink alcohol or use drugs.   Family History:  The patient's family history includes Hypertension in her father and mother.    ROS:  Please see the history of present illness.   Otherwise, review of systems are positive for occasional pain in the chest left breast with deep breathing.  All other systems are reviewed and negative.    PHYSICAL EXAM: VS:  BP 104/68 (BP Location: Left Arm)   Pulse 71   Ht 5\' 8"  (1.727 m)   Wt 207 lb 12.8 oz (94.3 kg)   BMI 31.60 kg/m  , BMI Body mass index is 31.6 kg/m. GEN: Well nourished, well developed, in no acute distress  HEENT: normal  Neck: no JVD, carotid bruits, or masses Cardiac: RRR; no murmurs, rubs, or gallops,no edema  Respiratory:  clear to auscultation bilaterally, normal work of breathing GI: soft, nontender, nondistended, + BS MS: no deformity or atrophy  Skin: warm and dry, no rash Neuro:   Strength and sensation are intact Psych: euthymic mood, full affect     Recent Labs: No results found for requested labs within last 8760 hours.   Lipid Panel    Component Value Date/Time   CHOL 126 04/21/2015 1058   TRIG 129.0 04/21/2015 1058   HDL 35.60 (L) 04/21/2015 1058   CHOLHDL 4 04/21/2015 1058   VLDL 25.8 04/21/2015 1058   LDLCALC 64 04/21/2015 1058   LDLDIRECT 125 (H) 04/08/2012 1513     Other studies Reviewed: Additional studies/ records that were reviewed today with results demonstrating: Cath report showed DES to the LAD.Marland Kitchen.   ASSESSMENT AND PLAN:  1. CAD/Old MI: No typical angina.  Chest pain is atypical.  Nonexertional pain under left breast- worse with inspiration.  Not like MI pain.  COntinue aspirin.  She stopped Plavix.  2. Hyperlipidemia:  LDL improved.  Continue high dose atorvastatin.  She needs lipids checked.  SHe will do this with [place] new PMD in [place] in January.  Will have results sent to us.  3. No signs of CHF despite old MI. 4. No ACE-I due to hypotension and cough in the past.   Current medicines are reviewed at length with the patient today.  The patient concerns regarding her medicines were addressed.  The following changes have been made:  No change  Labs/ tests ordered today include:  No orders of the defined types were placed in this encounter.   Recommend 150 minutes/week of aerobic exercise Low fat, low carb, high fiber diet recommended  Disposition:   FU in 1 year.   Signed, Lance MussJayadeep Jann Ra, MD  08/01/2016 10:39 AM    [facility] [address], [place], [place]  [address] Phone: [phone]; Fax: [phone]

## 2016-08-01 NOTE — Patient Instructions (Signed)
Medication Instructions:  Same-no changes  Labwork: CMET and Lipids at your Primary cares office. Please have them fax results to Dr Eldridge DaceVaranasi at [phone].  Testing/Procedures: None  Follow-Up: Your physician wants you to follow-up in: 1 year. You will receive a reminder letter in the mail two months in advance. If you don't receive a letter, please call our office to schedule the follow-up appointment.     If you need a refill on your cardiac medications before your next appointment, please call your pharmacy.

## 2017-03-05 ENCOUNTER — Other Ambulatory Visit: Payer: Self-pay | Admitting: Interventional Cardiology

## 2017-08-07 ENCOUNTER — Other Ambulatory Visit: Payer: Self-pay | Admitting: Interventional Cardiology

## 2017-08-10 ENCOUNTER — Other Ambulatory Visit: Payer: Self-pay | Admitting: Interventional Cardiology

## 2017-09-08 ENCOUNTER — Other Ambulatory Visit: Payer: Self-pay | Admitting: Interventional Cardiology

## 2017-09-12 ENCOUNTER — Other Ambulatory Visit: Payer: Self-pay | Admitting: Interventional Cardiology

## 2017-09-16 ENCOUNTER — Other Ambulatory Visit: Payer: Self-pay | Admitting: Interventional Cardiology

## 2017-10-03 ENCOUNTER — Telehealth: Payer: Self-pay | Admitting: Interventional Cardiology

## 2017-10-03 NOTE — Telephone Encounter (Deleted)
Error

## 2017-10-14 ENCOUNTER — Ambulatory Visit: Payer: Self-pay | Admitting: Interventional Cardiology

## 2017-10-25 ENCOUNTER — Encounter: Payer: Self-pay | Admitting: Interventional Cardiology

## 2017-11-04 NOTE — Progress Notes (Signed)
Cardiology Office Note   Date:  11/05/2017   ID:  Norma FootsKaren Bell, DOB 1966-07-04, MRN [id number]  PCP:  Maurice MarchMcPherson, Norma B, MD    No chief complaint on file.  CAD  Wt Readings from Last 3 Encounters:  11/05/17 220 lb 3.2 oz (99.9 kg)  08/01/16 207 lb 12.8 oz (94.3 kg)  06/02/15 193 lb 6.4 oz (87.7 kg)       History of Present Illness: Norma Bell is a 52 y.o. female  Who had an anterior MI treated with DES placement in 6/16.  She has done well.   Lisinopril was stopped due to low BP and cough in the past.     She was told to weight herself daily.  EF was in the 45-50% range.  No recent signs of fluid overload.  No edema.    Tolerating Plavix. Off of aspirin.    [place]oved to [place], [place].  Will still f/u here in [place] for cardiology.  Has had rib pain with deep breathing.  Her father died unexpectedly.  THis hsa been a source of stress and ay have led to some weight gain per her report.  Denies : Exertional Chest pain. Dizziness. Leg edema. Nitroglycerin use. Orthopnea. Palpitations. Paroxysmal nocturnal dyspnea. Shortness of breath. Syncope.   She bowls and does some walking outside with her grandson. She has to push the stroller.    On fall due to vertigo from Meniere's disease.  Is able to hear only with a cochlear implant.   Past Medical History:  Diagnosis Date  . Allergy   . HOH (hard of hearing)    Bilateral cochlear implants  . Hyperlipidemia LDL goal <70   . Meniere disease     Past Surgical History:  Procedure Laterality Date  . APPENDECTOMY    . CARDIAC CATHETERIZATION N/A 02/01/2015   Procedure: Left Heart Cath and Coronary Angiography;  Surgeon: Corky CraftsJayadeep S Omelia Marquart, MD;  [facility];  Service: Cardiovascular;  Laterality: N/A;  . CARDIAC CATHETERIZATION  02/01/2015   Procedure: Coronary Stent Intervention;  Surgeon: Corky CraftsJayadeep S Mandie Crabbe, MD;  [facility];  Service: Cardiovascular;;  . COCHLEAR IMPLANT    . cochlear  implant left ear    . JOINT REPLACEMENT    . PARTIAL HIP ARTHROPLASTY Left   . stunts in both ears    . TUBAL LIGATION       Current Outpatient Medications  Medication Sig Dispense Refill  . amitriptyline (ELAVIL) 75 MG tablet Take 75 mg by mouth at bedtime.    Marland Kitchen. atorvastatin (LIPITOR) 80 MG tablet TAKE ONE TABLET BY MOUTH ONCE DAILY AT 6 PM 30 tablet 0  . carvedilol (COREG) 3.125 MG tablet TAKE 1 TABLET BY MOUTH TWICE DAILY WITH FOOD. Please keep upcoming appt for future refills. Thank you 60 tablet 1  . clopidogrel (PLAVIX) 75 MG tablet TAKE 1 TABLET BY MOUTH ONCE DAILY 30 tablet 1  . nitroGLYCERIN (NITROSTAT) 0.4 MG SL tablet Place 1 tablet (0.4 mg total) under the tongue every 5 (five) minutes x 3 doses as needed for chest pain. 25 tablet 6   No current facility-administered medications for this visit.     Allergies:   Sucralfate; Ranitidine; Prilosec [omeprazole]; and Other    Social History:  The patient  reports that she has never smoked. She has never used smokeless tobacco. She reports that she does not drink alcohol or use drugs.   Family History:  The patient's family history  includes Hypertension in her father and mother.    ROS:  Please see the history of present illness.   Otherwise, review of systems are positive for recent stress while moving her son.   All other systems are reviewed and negative.    PHYSICAL EXAM: VS:  BP 110/82   Pulse 69   Ht 5\' 8"  (1.727 m)   Wt 220 lb 3.2 oz (99.9 kg)   SpO2 97%   BMI 33.48 kg/m  , BMI Body mass index is 33.48 kg/m. GEN: Well nourished, well developed, in no acute distress  HEENT: coclear implant present Neck: no JVD, carotid bruits, or masses Cardiac: RRR; no murmurs, rubs, or gallops,no edema  Respiratory:  clear to auscultation bilaterally, normal work of breathing GI: soft, nontender, nondistended, + BS MS: no deformity or atrophy  Skin: warm and dry, no rash Neuro:  Strength and sensation are intact Psych:  euthymic mood, full affect   EKG:   The ekg ordered today demonstrates NSR, no ST changes   Recent Labs: No results found for requested labs within last 8760 hours.   Lipid Panel    Component Value Date/Time   CHOL 126 04/21/2015 1058   TRIG 129.0 04/21/2015 1058   HDL 35.60 (L) 04/21/2015 1058   CHOLHDL 4 04/21/2015 1058   VLDL 25.8 04/21/2015 1058   LDLCALC 64 04/21/2015 1058   LDLDIRECT 125 (H) 04/08/2012 1513     Other studies Reviewed: Additional studies/ records that were reviewed today with results demonstrating: lab results reviewed.   ASSESSMENT AND PLAN:  1. CAD/Old MI: No angina on medical therapy.  COntinue aggressive secondary prevention. 2. Hyperlipidemia:  Will check labs today.  LDL target 70.  Continue statin.  3. No ACE-I due to cough and hypotension in the past.  4. Obesity: She will work on weight loss. Avoid added sugar.    Current medicines are reviewed at length with the patient today.  The patient concerns regarding her medicines were addressed.  The following changes have been made:  No change  Labs/ tests ordered today include: CMet and lipids No orders of the defined types were placed in this encounter.   Recommend 150 minutes/week of aerobic exercise Low fat, low carb, high fiber diet recommended  Disposition:   FU in 1 year   Signed, Lance Muss, MD  11/05/2017 10:26 AM    [facility] [address] [place], [place], [place]  [address] Phone: [phone]; Fax: [phone]

## 2017-11-05 ENCOUNTER — Ambulatory Visit (INDEPENDENT_AMBULATORY_CARE_PROVIDER_SITE_OTHER): Payer: BLUE CROSS/BLUE SHIELD | Admitting: Interventional Cardiology

## 2017-11-05 ENCOUNTER — Other Ambulatory Visit: Payer: Self-pay

## 2017-11-05 ENCOUNTER — Encounter: Payer: Self-pay | Admitting: Interventional Cardiology

## 2017-11-05 VITALS — BP 110/82 | HR 69 | Ht 68.0 in | Wt 220.2 lb

## 2017-11-05 DIAGNOSIS — I252 Old myocardial infarction: Secondary | ICD-10-CM

## 2017-11-05 DIAGNOSIS — E785 Hyperlipidemia, unspecified: Secondary | ICD-10-CM | POA: Diagnosis not present

## 2017-11-05 DIAGNOSIS — I25118 Atherosclerotic heart disease of native coronary artery with other forms of angina pectoris: Secondary | ICD-10-CM

## 2017-11-05 LAB — COMPREHENSIVE METABOLIC PANEL
ALBUMIN: 4.5 g/dL (ref 3.5–5.5)
ALK PHOS: 145 IU/L — AB (ref 39–117)
ALT: 21 IU/L (ref 0–32)
AST: 21 IU/L (ref 0–40)
Albumin/Globulin Ratio: 1.6 (ref 1.2–2.2)
BUN/Creatinine Ratio: 11 (ref 9–23)
BUN: 10 mg/dL (ref 6–24)
Bilirubin Total: 0.5 mg/dL (ref 0.0–1.2)
CHLORIDE: 101 mmol/L (ref 96–106)
CO2: 23 mmol/L (ref 20–29)
CREATININE: 0.95 mg/dL (ref 0.57–1.00)
Calcium: 9.3 mg/dL (ref 8.7–10.2)
GFR calc Af Amer: 81 mL/min/{1.73_m2} (ref >59)
GFR calc non Af Amer: 71 mL/min/{1.73_m2} (ref >59)
GLUCOSE: 122 mg/dL — AB (ref 65–99)
Globulin, Total: 2.9 g/dL (ref 1.5–4.5)
Potassium: 4.5 mmol/L (ref 3.5–5.2)
Sodium: 139 mmol/L (ref 134–144)
Total Protein: 7.4 g/dL (ref 6.0–8.5)

## 2017-11-05 LAB — LIPID PANEL
Chol/HDL Ratio: 3.4 ratio (ref 0.0–4.4)
Cholesterol, Total: 129 mg/dL (ref 100–199)
HDL: 38 mg/dL — AB (ref >39)
LDL CALC: 63 mg/dL (ref 0–99)
TRIGLYCERIDES: 138 mg/dL (ref 0–149)
VLDL CHOLESTEROL CAL: 28 mg/dL (ref 5–40)

## 2017-11-05 MED ORDER — CLOPIDOGREL BISULFATE 75 MG PO TABS: 75.0000 mg | ORAL_TABLET | Freq: Every day | ORAL | 3 refills | Status: DC

## 2017-11-05 MED ORDER — NITROGLYCERIN 0.4 MG SL SUBL: 0.4000 mg | SUBLINGUAL_TABLET | SUBLINGUAL | 6 refills | Status: DC | PRN

## 2017-11-05 MED ORDER — CARVEDILOL 3.125 MG PO TABS: ORAL_TABLET | ORAL | 3 refills | Status: DC

## 2017-11-05 MED ORDER — ATORVASTATIN CALCIUM 80 MG PO TABS: ORAL_TABLET | ORAL | 3 refills | Status: DC

## 2017-11-05 NOTE — Patient Instructions (Signed)
Medication Instructions:  Your physician recommends that you continue on your current medications as directed. Please refer to the Current Medication list given to you today.   Labwork: TODAY: CMET, LIPIDS  Testing/Procedures: None ordered  Follow-Up: Your physician wants you to follow-up in: 1 year with Dr. Varanasi. You will receive a reminder letter in the mail two months in advance. If you don't receive a letter, please call our office to schedule the follow-up appointment.   Any Other Special Instructions Will Be Listed Below (If Applicable).     If you need a refill on your cardiac medications before your next appointment, please call your pharmacy.   

## 2017-11-07 ENCOUNTER — Telehealth: Payer: Self-pay | Admitting: Interventional Cardiology

## 2017-11-07 NOTE — Telephone Encounter (Signed)
-----   Message from Corky CraftsJayadeep S Varanasi, MD sent at 11/06/2017  4:42 PM EDT ----- Lipids and electrolytes well controlled. Please cc: PMD.

## 2017-11-07 NOTE — Telephone Encounter (Signed)
Patient is hearing impaired. Spoke with patient's husband (EC) and made him aware of lab results. Patient does not have a PCP at this time because they just switched insurances.

## 2017-11-07 NOTE — Telephone Encounter (Signed)
Patient Husband Norma Bell(Rodney) returning call for lab results.

## 2018-09-08 ENCOUNTER — Telehealth: Payer: Self-pay | Admitting: Interventional Cardiology

## 2018-09-08 NOTE — Telephone Encounter (Signed)
Did not need this encounter °

## 2018-11-10 ENCOUNTER — Telehealth: Payer: Self-pay

## 2018-11-10 ENCOUNTER — Other Ambulatory Visit: Payer: Self-pay

## 2018-11-10 MED ORDER — ATORVASTATIN CALCIUM 80 MG PO TABS: ORAL_TABLET | ORAL | 1 refills | Status: DC

## 2018-11-10 MED ORDER — CLOPIDOGREL BISULFATE 75 MG PO TABS: 75.0000 mg | ORAL_TABLET | Freq: Every day | ORAL | 1 refills | Status: DC

## 2018-11-10 MED ORDER — AMITRIPTYLINE HCL 75 MG PO TABS: 75.0000 mg | ORAL_TABLET | Freq: Every day | ORAL | 1 refills | Status: DC

## 2018-11-10 MED ORDER — CARVEDILOL 3.125 MG PO TABS: ORAL_TABLET | ORAL | 1 refills | Status: DC

## 2018-11-10 MED ORDER — CLOPIDOGREL BISULFATE 75 MG PO TABS: 75.0000 mg | ORAL_TABLET | Freq: Every day | ORAL | 3 refills | Status: DC

## 2018-11-10 NOTE — Addendum Note (Signed)
Addended by: Demetrios Loll on: 11/10/2018 09:42 AM   Modules accepted: Orders

## 2018-11-10 NOTE — Telephone Encounter (Signed)
Spoke with pt family member listed on [place]. Pt is hard of hearing and cannot do a video or telephone visit, would prefer to have face to face visit. Instructed him they will receive a call from the nurse rescheduling visit.

## 2018-11-10 NOTE — Telephone Encounter (Signed)
[name] husband calling stating that the pt's medications were sent to the wrong pharmacy and requested that pt's medications be resent to a different pharmacy. I resent the pt's medications to the pharmacy as requested. Confirmation received.

## 2018-11-12 ENCOUNTER — Ambulatory Visit: Payer: Self-pay | Admitting: Interventional Cardiology

## 2019-02-06 ENCOUNTER — Telehealth: Payer: Self-pay

## 2019-02-06 NOTE — Telephone Encounter (Signed)
Patient rescheduled for an in office visit on 6/29. Patient answers no to all screening questions below. Patient understands that there are no visitors allowed. Patient understands not to arrive more than 15 minutes prior to the scheduled appointment time and that a mask will be required for the visit.      COVID-19 Pre-Screening Questions:  . In the past 7 to 10 days have you had a cough,  shortness of breath, headache, congestion, fever (100 or greater) body aches, chills, sore throat, or sudden loss of taste or sense of smell? NO . Have you been around anyone with known Covid 19? NO . Have you been around anyone who is awaiting Covid 19 test results in the past 7 to 10 days? NO . Have you been around anyone who has been exposed to Covid 19, or has mentioned symptoms of Covid 19 within the past 7 to 10 days? NO

## 2019-02-08 NOTE — Progress Notes (Signed)
Cardiology Office Note   Date:  02/09/2019   ID:  Norma Bell, DOB [birth year], MRN [id number]  PCP:  Patient, No Pcp Per    No chief complaint on file.  CAD  Wt Readings from Last 3 Encounters:  02/09/19 229 lb 6.4 oz (104.1 kg)  11/05/17 220 lb 3.2 oz (99.9 kg)  08/01/16 207 lb 12.8 oz (94.3 kg)       History of Present Illness: Norma Bell is a 53 y.o. female   Who had an anterior MI treated with DES placement in 6/16. She has done well.   Lisinopril was stopped due to low BP and coughin the past.    She was told to weight herself daily. EF was in the 45-50% range. No recent signs of fluid overload. No edema.   Tolerating Plavix. Off of aspirin.   Moved to [place], [place]. Will still f/u here in [place] for cardiology. Now looking to see someone in [place].  In the past it was noted that : "She Has had rib pain with deep breathing.  She bowls and does some walking outside with her grandson. She has to push the stroller.    One fall due to vertigo from Meniere's disease.  Is able to hear only with a cochlear implant."  Denies : Chest pain. Dizziness. Leg edema. Nitroglycerin use. Orthopnea. Palpitations. Paroxysmal nocturnal dyspnea. Shortness of breath. Syncope.   Gaining weight.  Not exercising as instructed.  Watches her grandkids.     Past Medical History:  Diagnosis Date  . Allergy   . HOH (hard of hearing)    Bilateral cochlear implants  . Hyperlipidemia LDL goal <70   . Meniere disease     Past Surgical History:  Procedure Laterality Date  . APPENDECTOMY    . CARDIAC CATHETERIZATION N/A 02/01/2015   Procedure: Left Heart Cath and Coronary Angiography;  Surgeon: Jettie Booze, MD;  Location: [facility];  Service: Cardiovascular;  Laterality: N/A;  . CARDIAC CATHETERIZATION  02/01/2015   Procedure: Coronary Stent Intervention;  Surgeon: Jettie Booze, MD;  Location: [facility];  Service: Cardiovascular;;  .  COCHLEAR IMPLANT    . cochlear implant left ear    . JOINT REPLACEMENT    . PARTIAL HIP ARTHROPLASTY Left   . stunts in both ears    . TUBAL LIGATION       Current Outpatient Medications  Medication Sig Dispense Refill  . amitriptyline (ELAVIL) 75 MG tablet Take 1 tablet (75 mg total) by mouth at bedtime. 90 tablet 1  . atorvastatin (LIPITOR) 80 MG tablet TAKE ONE TABLET BY MOUTH ONCE DAILY AT 6 PM 90 tablet 1  . carvedilol (COREG) 3.125 MG tablet TAKE 1 TABLET BY MOUTH TWICE DAILY WITH FOOD. 180 tablet 1  . clopidogrel (PLAVIX) 75 MG tablet Take 1 tablet (75 mg total) by mouth daily. 90 tablet 1  . nitroGLYCERIN (NITROSTAT) 0.4 MG SL tablet Place 1 tablet (0.4 mg total) under the tongue every 5 (five) minutes x 3 doses as needed for chest pain. 25 tablet 6   No current facility-administered medications for this visit.     Allergies:   Sucralfate, Ranitidine, Prilosec [omeprazole], and Other    Social History:  The patient  reports that she has never smoked. She has never used smokeless tobacco. She reports that she does not drink alcohol or use drugs.   Family History:  The patient's family history includes Hypertension in her father and  mother.    ROS:  Please see the history of present illness.   Otherwise, review of systems are positive for mild ankle edema- compression stocking recommended.   All other systems are reviewed and negative.    PHYSICAL EXAM: VS:  Ht 5\' 8"  (1.727 m)   Wt 229 lb 6.4 oz (104.1 kg)   SpO2 95%   BMI 34.88 kg/m  , BMI Body mass index is 34.88 kg/m. GEN: Well nourished, well developed, in no acute distress  HEENT: normal  Neck: no JVD, carotid bruits, or masses Cardiac: RRR; no murmurs, rubs, or gallops,no edema  Respiratory:  clear to auscultation bilaterally, normal work of breathing GI: soft, nontender, nondistended, + BS, obese MS: no deformity or atrophy  Skin: warm and dry, no rash Neuro:  Strength and sensation are intact Psych:  euthymic mood, full affect   EKG:   The ekg ordered today demonstrates NSR, septal Q waves, no ST changes   Recent Labs: No results found for requested labs within last 8760 hours.   Lipid Panel    Component Value Date/Time   CHOL 129 11/05/2017 1043   TRIG 138 11/05/2017 1043   HDL 38 (L) 11/05/2017 1043   CHOLHDL 3.4 11/05/2017 1043   CHOLHDL 4 04/21/2015 1058   VLDL 25.8 04/21/2015 1058   LDLCALC 63 11/05/2017 1043   LDLDIRECT 125 (H) 04/08/2012 1513     Other studies Reviewed: Additional studies/ records that were reviewed today with results demonstrating: labs reviewed   ASSESSMENT AND PLAN:  1. CAD/Old MI: Angina controlled on medical therapy.  No sx like her MI.  Just some persistent atypical chest pain. 2. Hyperlipidemia: Recheck lipids today.  Continue lipid lowering therapy. 3. Obesity: Weight increased since prior. We spoke about healthy diet and more exercise. 4. No ACE-I due to cough and hypotension in the past. 5. Pre-op: She needs to have a hysterectomy at some point.  I think she could have the surgery with no other cardiac testing.  Plavix would have to be held for 5 days prior to surgery. 6. She lives in [place] now.  I recommended that she follow up with Dr. Johnnette [place]itterBob [place] in [place].  Phone number was given.    Current medicines are reviewed at length with the patient today.  The patient concerns regarding her medicines were addressed.  The following changes have been made:  No change  Labs/ tests ordered today include:  No orders of the defined types were placed in this encounter.   Recommend 150 minutes/week of aerobic exercise Low fat, low carb, high fiber diet recommended  Disposition:   FU as needed.   Signed, Lance MussJayadeep Chapman Matteucci, MD  02/09/2019 9:06 AM    [facility] [address], [place], [place]  [address] Phone: [phone]; Fax: [phone]

## 2019-02-09 ENCOUNTER — Other Ambulatory Visit: Payer: Self-pay

## 2019-02-09 ENCOUNTER — Encounter: Payer: Self-pay | Admitting: Interventional Cardiology

## 2019-02-09 ENCOUNTER — Ambulatory Visit (INDEPENDENT_AMBULATORY_CARE_PROVIDER_SITE_OTHER): Payer: 59 | Admitting: Interventional Cardiology

## 2019-02-09 VITALS — BP 128/82 | HR 81 | Ht 68.0 in | Wt 229.4 lb

## 2019-02-09 DIAGNOSIS — I25118 Atherosclerotic heart disease of native coronary artery with other forms of angina pectoris: Secondary | ICD-10-CM

## 2019-02-09 DIAGNOSIS — E785 Hyperlipidemia, unspecified: Secondary | ICD-10-CM

## 2019-02-09 DIAGNOSIS — I252 Old myocardial infarction: Secondary | ICD-10-CM | POA: Diagnosis not present

## 2019-02-09 LAB — COMPREHENSIVE METABOLIC PANEL
ALT: 22 IU/L (ref 0–32)
AST: 19 IU/L (ref 0–40)
Albumin/Globulin Ratio: 1.7 (ref 1.2–2.2)
Albumin: 4.7 g/dL (ref 3.8–4.9)
Alkaline Phosphatase: 126 IU/L — ABNORMAL HIGH (ref 39–117)
BUN/Creatinine Ratio: 14 (ref 9–23)
BUN: 13 mg/dL (ref 6–24)
Bilirubin Total: 0.5 mg/dL (ref 0.0–1.2)
CO2: 20 mmol/L (ref 20–29)
Calcium: 9.6 mg/dL (ref 8.7–10.2)
Chloride: 100 mmol/L (ref 96–106)
Creatinine, Ser: 0.93 mg/dL (ref 0.57–1.00)
GFR calc Af Amer: 82 mL/min/{1.73_m2} (ref >59)
GFR calc non Af Amer: 71 mL/min/{1.73_m2} (ref >59)
Globulin, Total: 2.8 g/dL (ref 1.5–4.5)
Glucose: 121 mg/dL — ABNORMAL HIGH (ref 65–99)
Potassium: 4.1 mmol/L (ref 3.5–5.2)
Sodium: 137 mmol/L (ref 134–144)
Total Protein: 7.5 g/dL (ref 6.0–8.5)

## 2019-02-09 LAB — LIPID PANEL
Chol/HDL Ratio: 3.5 ratio (ref 0.0–4.4)
Cholesterol, Total: 137 mg/dL (ref 100–199)
HDL: 39 mg/dL — ABNORMAL LOW (ref >39)
LDL Calculated: 73 mg/dL (ref 0–99)
Triglycerides: 123 mg/dL (ref 0–149)
VLDL Cholesterol Cal: 25 mg/dL (ref 5–40)

## 2019-02-09 MED ORDER — NITROGLYCERIN 0.4 MG SL SUBL: 0.4000 mg | SUBLINGUAL_TABLET | SUBLINGUAL | 6 refills | Status: AC | PRN

## 2019-02-09 MED ORDER — CARVEDILOL 3.125 MG PO TABS: ORAL_TABLET | ORAL | 3 refills | Status: AC

## 2019-02-09 MED ORDER — CLOPIDOGREL BISULFATE 75 MG PO TABS: 75.0000 mg | ORAL_TABLET | Freq: Every day | ORAL | 3 refills | Status: AC

## 2019-02-09 NOTE — Patient Instructions (Signed)
Medication Instructions:  Your physician recommends that you continue on your current medications as directed. Please refer to the Current Medication list given to you today.  If you need a refill on your cardiac medications before your next appointment, please call your pharmacy.   Lab work: Today: Lipid panel and CMET  If you have labs (blood work) drawn today and your tests are completely normal, you will receive your results only by: Marland Kitchen MyChart Message (if you have MyChart) OR . A paper copy in the mail If you have any lab test that is abnormal or we need to change your treatment, we will call you to review the results.  Testing/Procedures: None ordered  Follow-Up:  As needed  Contact Dr. Patsey Berthold at [phone] to establish care.

## 2019-02-23 ENCOUNTER — Telehealth: Payer: Self-pay | Admitting: Interventional Cardiology

## 2019-04-14 ENCOUNTER — Other Ambulatory Visit: Payer: Self-pay | Admitting: Interventional Cardiology

## 2019-06-08 ENCOUNTER — Other Ambulatory Visit: Payer: Self-pay | Admitting: Interventional Cardiology

## 2019-08-05 NOTE — Telephone Encounter (Signed)
error 

## 2020-01-19 ENCOUNTER — Other Ambulatory Visit: Payer: Self-pay | Admitting: Interventional Cardiology

## 2020-08-10 ENCOUNTER — Other Ambulatory Visit: Payer: Self-pay | Admitting: Interventional Cardiology

## 2022-06-13 LAB — COLOGUARD: COLOGUARD: NEGATIVE

## 2024-02-21 ENCOUNTER — Ambulatory Visit (HOSPITAL_BASED_OUTPATIENT_CLINIC_OR_DEPARTMENT_OTHER)

## 2024-02-21 ENCOUNTER — Encounter (HOSPITAL_BASED_OUTPATIENT_CLINIC_OR_DEPARTMENT_OTHER): Payer: Self-pay

## 2024-02-21 ENCOUNTER — Other Ambulatory Visit (HOSPITAL_BASED_OUTPATIENT_CLINIC_OR_DEPARTMENT_OTHER): Payer: Self-pay

## 2024-02-21 ENCOUNTER — Ambulatory Visit (HOSPITAL_BASED_OUTPATIENT_CLINIC_OR_DEPARTMENT_OTHER): Payer: Self-pay | Admitting: Orthopaedic Surgery

## 2024-02-21 ENCOUNTER — Ambulatory Visit (HOSPITAL_BASED_OUTPATIENT_CLINIC_OR_DEPARTMENT_OTHER): Admitting: Orthopaedic Surgery

## 2024-02-21 DIAGNOSIS — M25551 Pain in right hip: Secondary | ICD-10-CM | POA: Diagnosis not present

## 2024-02-21 MED ORDER — OXYCODONE HCL 5 MG PO TABS: 5.0000 mg | ORAL_TABLET | ORAL | 0 refills | Status: AC | PRN

## 2024-02-21 MED ORDER — ACETAMINOPHEN 500 MG PO TABS: 500.0000 mg | ORAL_TABLET | Freq: Three times a day (TID) | ORAL | 0 refills | Status: AC

## 2024-02-21 NOTE — Addendum Note (Signed)
 Addended by: Summer Parthasarathy L on: 02/21/2024 12:56 PM   Modules accepted: Orders

## 2024-02-21 NOTE — Progress Notes (Signed)
 Chief Complaint: Right hip pain     History of Present Illness:    Norma Bell is a 58 y.o. female presents with ongoing right hip pain for the last several years.  She has not gotten a opinion in [facility] regarding the right hip.  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  She is here today for further discussion.  She has had a several injections into the hip without permanent relief.  She has undergone physical therapy which does seem to make the hip pain worse.  This time she is having pain in the groin crease with is worse with flexion or sitting for longer periods of time.  She does have a cochlear implant    PMH/PSH/Family History/Social History/Meds/Allergies:    Past Medical History:  Diagnosis Date   Allergy    HOH (hard of hearing)    Bilateral cochlear implants   Hyperlipidemia LDL goal <70    Meniere disease    Past Surgical History:  Procedure Laterality Date   APPENDECTOMY     CARDIAC CATHETERIZATION N/A 02/01/2015   Procedure: Left Heart Cath and Coronary Angiography;  Surgeon: Candyce GORMAN Reek, MD;  Location: [facility];  Service: Cardiovascular;  Laterality: N/A;   CARDIAC CATHETERIZATION  02/01/2015   Procedure: Coronary Stent Intervention;  Surgeon: Candyce GORMAN Reek, MD;  Location: [facility] INVASIVE CV LAB;  Service: Cardiovascular;;   COCHLEAR IMPLANT     cochlear implant left ear     JOINT REPLACEMENT     PARTIAL HIP ARTHROPLASTY Left    stunts in both ears     TUBAL LIGATION     Social History   Socioeconomic History   Marital status: Married    Spouse name: Not on file   Number of children: Not on file   Years of education: Not on file   Highest education level: Not on file  Occupational History   Occupation: student    Employer: STUDENT  Tobacco Use   Smoking status: Never   Smokeless tobacco: Never  Substance and Sexual Activity   Alcohol use: No   Drug use: No   Sexual  activity: Yes    Birth control/protection: Surgical, Pill  Other Topics Concern   Not on file  Social History Narrative   Exercises 3x's week 30 min sessions.   Social Drivers of Corporate investment banker Strain: Not on file  Food Insecurity: Not on file  Transportation Needs: Not on file  Physical Activity: Not on file  Stress: Not on file  Social Connections: Not on file   Family History  Problem Relation Age of Onset   Hypertension Mother    Hypertension Father    Allergies  Allergen Reactions   Sucralfate Itching and Swelling    Face/tongue    Ranitidine Swelling    flushing   Prilosec [Omeprazole] Swelling   Other Rash    Oranges, grapes, additives, preservatives, and Granny Smith apples (red apples OK). - Breaks out in what looks like bites. Some trouble breathing.   Current Outpatient Medications  Medication Sig Dispense Refill   amitriptyline  (ELAVIL ) 75 MG tablet TAKE 1 TABLET BY MOUTH AT BEDTIME 90 tablet 3   atorvastatin  (LIPITOR ) 80 MG tablet TAKE 1 TABLET BY MOUTH ONCE DAILY AT 6 PM. PLEASE MAKE OVERDUE APPT WITH DR. CLAUDENE BEFORE MORE REFILLS. 2ND ATTEMPT 15 tablet 0   carvedilol  (COREG ) 3.125 MG tablet TAKE 1 TABLET  BY MOUTH TWICE DAILY WITH FOOD. 180 tablet 3   clopidogrel  (PLAVIX ) 75 MG tablet Take 1 tablet (75 mg total) by mouth daily. 90 tablet 3   nitroGLYCERIN  (NITROSTAT ) 0.4 MG SL tablet Place 1 tablet (0.4 mg total) under the tongue every 5 (five) minutes x 3 doses as needed for chest pain. 25 tablet 6   No current facility-administered medications for this visit.   No results found.  Review of Systems:   A ROS was performed including pertinent positives and negatives as documented in the HPI.  Physical Exam :   Constitutional: NAD and appears stated age Neurological: Alert and oriented Psych: Appropriate affect and cooperative There were no vitals taken for this visit.   Comprehensive Musculoskeletal Exam:    Right hip with tenderness  about the femoral acetabular joint particular the FADIR position 90 degrees flexion 30 degrees internal rotation.  This is improved with 45 degrees external rotation.  Distal neurosensory exam is intact.  Good abduction strength   Imaging:   Xray (3 views right hip): Significant pincer impingement with well-preserved femoral acetabular joint  CT scan right hip: No evidence of osteoarthritis with again pincer lesion   I personally reviewed and interpreted the radiographs.   Assessment and Plan:   58 y.o. 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  At this time she has trialed both physical therapy as well as injections without any relief.  Given this we did discuss the possibility of right hip arthroscopy with pincer debridement.  I did discuss risk limitations associated with this I discussed the associated recovery timeframe.  After discussion she would like to proceed   -plan for right hip arthroscopy with pincer debridement  After a lengthy discussion of treatment options, including risks, benefits, alternatives, complications of surgical and nonsurgical conservative options, the patient elected surgical repair.   The patient  is aware of the material risks  and complications including, but not limited to injury to adjacent structures, neurovascular injury, infection, numbness, bleeding, implant failure, thermal burns, stiffness, persistent pain, failure to heal, disease transmission from allograft, need for further surgery, dislocation, anesthetic risks, blood clots, risks of death,and others. The probabilities of surgical success and failure discussed with patient given their particular co-morbidities.The time and nature of expected rehabilitation and recovery was discussed.The patient's questions were all answered preoperatively.  No barriers to understanding were noted. I explained the natural history of the disease process and Rx  rationale.  I explained to the patient what I considered to be reasonable expectations given their personal situation.  The final treatment plan was arrived at through a shared patient decision making process model.    I personally saw and evaluated the patient, and participated in the management and treatment plan.  Elspeth Parker, MD Attending Physician, Orthopedic Surgery  This document was dictated using Dragon voice recognition software. A reasonable attempt at proof reading has been made to minimize errors.

## 2024-03-25 ENCOUNTER — Ambulatory Visit (INDEPENDENT_AMBULATORY_CARE_PROVIDER_SITE_OTHER)

## 2024-03-25 ENCOUNTER — Other Ambulatory Visit (HOSPITAL_BASED_OUTPATIENT_CLINIC_OR_DEPARTMENT_OTHER): Payer: Self-pay

## 2024-03-25 ENCOUNTER — Ambulatory Visit (HOSPITAL_BASED_OUTPATIENT_CLINIC_OR_DEPARTMENT_OTHER): Admitting: Orthopaedic Surgery

## 2024-03-25 DIAGNOSIS — M25552 Pain in left hip: Secondary | ICD-10-CM | POA: Diagnosis not present

## 2024-03-25 MED ORDER — LIDOCAINE HCL 1 % IJ SOLN
4.0000 mL | INTRAMUSCULAR | Status: AC | PRN
  Administered 2024-03-25 (×2): 4 mL

## 2024-03-25 MED ORDER — TRIAMCINOLONE ACETONIDE 40 MG/ML IJ SUSP
80.0000 mg | INTRAMUSCULAR | Status: AC | PRN
  Administered 2024-03-25 (×2): 80 mg via INTRA_ARTICULAR

## 2024-03-25 NOTE — H&P (View-Only) (Signed)
 Chief Complaint: Left hip pain     History of Present Illness:   03/25/2024 presents today with ongoing left hip pain particular about the groin area.  She states that she did have a fall Sunday while getting onto a pond boot to an boat and the hip gave out.  Since that time she has been able to place full weight on the hip.  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 female presents with ongoing right hip pain for the last several years.  She has not gotten a opinion in [facility] regarding the right hip.  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  She is here today for further discussion.  She has had a several injections into the hip without permanent relief.  She has undergone physical therapy which does seem to make the hip pain worse.  This time she is having pain in the groin crease with is worse with flexion or sitting for longer periods of time.  She does have a cochlear implant    PMH/PSH/Family History/Social History/Meds/Allergies:    Past Medical History:  Diagnosis Date  . Allergy   . HOH (hard of hearing)    Bilateral cochlear implants  . Hyperlipidemia LDL goal <70   . Meniere disease    Past Surgical History:  Procedure Laterality Date  . APPENDECTOMY    . CARDIAC CATHETERIZATION N/A 02/01/2015   Procedure: Left Heart Cath and Coronary Angiography;  Surgeon: Candyce GORMAN Reek, MD;  Location: [facility]VASIVE [facility];  Service: Cardiovascular;  Laterality: N/A;  . CARDIAC CATHETERIZATION  02/01/2015   Procedure: Coronary Stent Intervention;  Surgeon: Candyce GORMAN Reek, MD;  Location: [facility] INVASIVE [facility];  Service: Cardiovascular;;  . COCHLEAR IMPLANT    . cochlear implant left ear    . JOINT REPLACEMENT    . PARTIAL HIP ARTHROPLASTY Left   . stunts in both ears    . TUBAL LIGATION     Social History    Socioeconomic History  . Marital status: Married    Spouse name: Not on file  . Number of children: Not on file  . Years of education: Not on file  . Highest education level: Not on file  Occupational History  . 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 Smoking status: Never  . Smokeless tobacco: Never  Substance and Sexual Activity  . Alcohol use: No  . Drug use: No  . Sexual activity: Yes    Birth control/protection: Surgical, Pill  Other Topics Concern  . Not on file  Social History Narrative   Exercises 3x's week 30 min sessions.   Social Drivers of Corporate investment banker Strain: Not on file  Food Insecurity: Not on file  Transportation Needs: Not on file  Physical Activity: Not on file  Stress: Not on file  Social Connections: Not on file   Family History  Problem Relation Age of Onset  . Hypertension Mother   . Hypertension Father    Allergies  Allergen Reactions  . Sucralfate Itching and Swelling    Face/tongue   . Ranitidine Swelling    flushing  . Prilosec [Omeprazole] Swelling  . Other Rash    Oranges, grapes, additives, preservatives, and Granny Smith apples (red apples OK). - Breaks out in what looks like  bites. Some trouble breathing.   Current Outpatient Medications  Medication Sig Dispense Refill  . amitriptyline  (ELAVIL ) 75 MG tablet TAKE 1 TABLET BY MOUTH AT BEDTIME 90 tablet 3  . atorvastatin  (LIPITOR ) 80 MG tablet TAKE 1 TABLET BY MOUTH ONCE DAILY AT 6 PM. PLEASE MAKE OVERDUE APPT WITH DR. CLAUDENE BEFORE MORE REFILLS. 2ND ATTEMPT 15 tablet 0  . carvedilol  (COREG ) 3.125 MG tablet TAKE 1 TABLET BY MOUTH TWICE DAILY WITH FOOD. 180 tablet 3  . clopidogrel  (PLAVIX ) 75 MG tablet Take 1 tablet (75 mg total) by mouth daily. 90 tablet 3  . nitroGLYCERIN  (NITROSTAT ) 0.4 MG SL tablet Place 1 tablet (0.4 mg total) under the tongue every 5 (five) minutes x 3 doses as needed for chest pain. 25 tablet 6  . oxyCODONE  (ROXICODONE ) 5 MG  immediate release tablet Take 1 tablet (5 mg total) by mouth every 4 (four) hours as needed for severe pain (pain score 7-10) or breakthrough pain. 10 tablet 0   No current facility-administered medications for this visit.   No results found.  Review of Systems:   A ROS was performed including pertinent positives and negatives as documented in the HPI.  Physical Exam :   Constitutional: NAD and appears stated age Neurological: Alert and oriented Psych: Appropriate affect and cooperative There were no vitals taken for this visit.   Comprehensive Musculoskeletal Exam:    Right hip with tenderness about the femoral acetabular joint particular the FADIR position 90 degrees flexion 30 degrees internal rotation.  This is improved with 45 degrees external rotation.  Distal neurosensory exam is intact.  Good abduction strength   Imaging:   Xray (3 views right hip): Significant pincer impingement with well-preserved femoral acetabular joint  CT scan right hip: No evidence of osteoarthritis with again pincer lesion   I personally reviewed and interpreted the radiographs.   Assessment and Plan:   58 y.o. 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  At this time she has trialed both physical therapy as well as injections without any relief.  Given this we did discuss the possibility of right hip arthroscopy with pincer debridement.  I did discuss risk limitations associated with this I discussed the associated recovery timeframe.  After discussion she would like to proceed  At today's visit she has left hip pain consistent with psoas tendinitis and given that I did recommend an ultrasound-guided injection of this area to get her in better mobility status prior to right hip surgery    Procedure Note  Patient: Norma Bell             Date of Birth: [birth year]           MRN: [id number]             Visit Date: 03/25/2024  Procedures: Visit  Diagnoses:  1. Pain in left hip     Large Joint Inj: L hip joint on 03/25/2024 12:46 PM Indications: pain Details: 22 G 3.5 in needle, ultrasound-guided anterolateral approach  Arthrogram: No  Medications: 4 mL lidocaine  1 %; 80 mg triamcinolone  acetonide 40 MG/ML Outcome: tolerated well, no immediate complications Procedure, treatment alternatives, risks and benefits explained, specific risks discussed. Consent was given by the patient. Immediately prior to procedure a time out was called to verify the correct patient, procedure, equipment, support staff and site/side marked as required. Patient was prepped and draped in the usual sterile fashion.         -  plan for right hip arthroscopy with pincer debridement  After a lengthy discussion of treatment options, including risks, benefits, alternatives, complications of surgical and nonsurgical conservative options, the patient elected surgical repair.   The patient  is aware of the material risks  and complications including, but not limited to injury to adjacent structures, neurovascular injury, infection, numbness, bleeding, implant failure, thermal burns, stiffness, persistent pain, failure to heal, disease transmission from allograft, need for further surgery, dislocation, anesthetic risks, blood clots, risks of death,and others. The probabilities of surgical success and failure discussed with patient given their particular co-morbidities.The time and nature of expected rehabilitation and recovery was discussed.The patient's questions were all answered preoperatively.  No barriers to understanding were noted. I explained the natural history of the disease process and Rx rationale.  I explained to the patient what I considered to be reasonable expectations given their personal situation.  The final treatment plan was arrived at through a shared patient decision making process model.    I personally saw and evaluated the patient, and  participated in the management and treatment plan.  Elspeth Parker, MD Attending Physician, Orthopedic Surgery  This document was dictated using Dragon voice recognition software. A reasonable attempt at proof reading has been made to minimize errors.

## 2024-03-25 NOTE — Progress Notes (Signed)
 Chief Complaint: Left hip pain     History of Present Illness:   03/25/2024 presents today with ongoing left hip pain particular about the groin area.  She states that she did have a fall Sunday while getting onto a pond boot to an boat and the hip gave out.  Since that time she has been able to place full weight on the hip.  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 female presents with ongoing right hip pain for the last several years.  She has not gotten a opinion in [facility] regarding the right hip.  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  She is here today for further discussion.  She has had a several injections into the hip without permanent relief.  She has undergone physical therapy which does seem to make the hip pain worse.  This time she is having pain in the groin crease with is worse with flexion or sitting for longer periods of time.  She does have a cochlear implant    PMH/PSH/Family History/Social History/Meds/Allergies:    Past Medical History:  Diagnosis Date  . Allergy   . HOH (hard of hearing)    Bilateral cochlear implants  . Hyperlipidemia LDL goal <70   . Meniere disease    Past Surgical History:  Procedure Laterality Date  . APPENDECTOMY    . CARDIAC CATHETERIZATION N/A 02/01/2015   Procedure: Left Heart Cath and Coronary Angiography;  Surgeon: Candyce GORMAN Reek, MD;  Location: [facility] INVASIVE CV LAB;  Service: Cardiovascular;  Laterality: N/A;  . CARDIAC CATHETERIZATION  02/01/2015   Procedure: Coronary Stent Intervention;  Surgeon: Candyce GORMAN Reek, MD;  Location: [facility] INVASIVE CV LAB;  Service: Cardiovascular;;  . COCHLEAR IMPLANT    . cochlear implant left ear    . JOINT REPLACEMENT    . PARTIAL HIP ARTHROPLASTY Left   . stunts in both ears    . TUBAL LIGATION     Social History    Socioeconomic History  . Marital status: Married    Spouse name: Not on file  . Number of children: Not on file  . Years of education: Not on file  . Highest education level: Not on file  Occupational History  . 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 Smoking status: Never  . Smokeless tobacco: Never  Substance and Sexual Activity  . Alcohol use: No  . Drug use: No  . Sexual activity: Yes    Birth control/protection: Surgical, Pill  Other Topics Concern  . Not on file  Social History Narrative   Exercises 3x's week 30 min sessions.   Social Drivers of Corporate investment banker Strain: Not on file  Food Insecurity: Not on file  Transportation Needs: Not on file  Physical Activity: Not on file  Stress: Not on file  Social Connections: Not on file   Family History  Problem Relation Age of Onset  . Hypertension Mother   . Hypertension Father    Allergies  Allergen Reactions  . Sucralfate Itching and Swelling    Face/tongue   . Ranitidine Swelling    flushing  . Prilosec [Omeprazole] Swelling  . Other Rash    Oranges, grapes, additives, preservatives, and Granny Smith apples (red apples OK). - Breaks out in what looks like  bites. Some trouble breathing.   Current Outpatient Medications  Medication Sig Dispense Refill  . amitriptyline  (ELAVIL ) 75 MG tablet TAKE 1 TABLET BY MOUTH AT BEDTIME 90 tablet 3  . atorvastatin  (LIPITOR ) 80 MG tablet TAKE 1 TABLET BY MOUTH ONCE DAILY AT 6 PM. PLEASE MAKE OVERDUE APPT WITH DR. CLAUDENE BEFORE MORE REFILLS. 2ND ATTEMPT 15 tablet 0  . carvedilol  (COREG ) 3.125 MG tablet TAKE 1 TABLET BY MOUTH TWICE DAILY WITH FOOD. 180 tablet 3  . clopidogrel  (PLAVIX ) 75 MG tablet Take 1 tablet (75 mg total) by mouth daily. 90 tablet 3  . nitroGLYCERIN  (NITROSTAT ) 0.4 MG SL tablet Place 1 tablet (0.4 mg total) under the tongue every 5 (five) minutes x 3 doses as needed for chest pain. 25 tablet 6  . oxyCODONE  (ROXICODONE ) 5 MG  immediate release tablet Take 1 tablet (5 mg total) by mouth every 4 (four) hours as needed for severe pain (pain score 7-10) or breakthrough pain. 10 tablet 0   No current facility-administered medications for this visit.   No results found.  Review of Systems:   A ROS was performed including pertinent positives and negatives as documented in the HPI.  Physical Exam :   Constitutional: NAD and appears stated age Neurological: Alert and oriented Psych: Appropriate affect and cooperative There were no vitals taken for this visit.   Comprehensive Musculoskeletal Exam:    Right hip with tenderness about the femoral acetabular joint particular the FADIR position 90 degrees flexion 30 degrees internal rotation.  This is improved with 45 degrees external rotation.  Distal neurosensory exam is intact.  Good abduction strength   Imaging:   Xray (3 views right hip): Significant pincer impingement with well-preserved femoral acetabular joint  CT scan right hip: No evidence of osteoarthritis with again pincer lesion   I personally reviewed and interpreted the radiographs.   Assessment and Plan:   58 y.o. 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  At this time she has trialed both physical therapy as well as injections without any relief.  Given this we did discuss the possibility of right hip arthroscopy with pincer debridement.  I did discuss risk limitations associated with this I discussed the associated recovery timeframe.  After discussion she would like to proceed  At today's visit she has left hip pain consistent with psoas tendinitis and given that I did recommend an ultrasound-guided injection of this area to get her in better mobility status prior to right hip surgery    Procedure Note  Patient: Norma Bell             Date of Birth: [birth year]           MRN: [id number]             Visit Date: 03/25/2024  Procedures: Visit  Diagnoses:  1. Pain in left hip     Large Joint Inj: L hip joint on 03/25/2024 12:46 PM Indications: pain Details: 22 G 3.5 in needle, ultrasound-guided anterolateral approach  Arthrogram: No  Medications: 4 mL lidocaine  1 %; 80 mg triamcinolone  acetonide 40 MG/ML Outcome: tolerated well, no immediate complications Procedure, treatment alternatives, risks and benefits explained, specific risks discussed. Consent was given by the patient. Immediately prior to procedure a time out was called to verify the correct patient, procedure, equipment, support staff and site/side marked as required. Patient was prepped and draped in the usual sterile fashion.         -  plan for right hip arthroscopy with pincer debridement  After a lengthy discussion of treatment options, including risks, benefits, alternatives, complications of surgical and nonsurgical conservative options, the patient elected surgical repair.   The patient  is aware of the material risks  and complications including, but not limited to injury to adjacent structures, neurovascular injury, infection, numbness, bleeding, implant failure, thermal burns, stiffness, persistent pain, failure to heal, disease transmission from allograft, need for further surgery, dislocation, anesthetic risks, blood clots, risks of death,and others. The probabilities of surgical success and failure discussed with patient given their particular co-morbidities.The time and nature of expected rehabilitation and recovery was discussed.The patient's questions were all answered preoperatively.  No barriers to understanding were noted. I explained the natural history of the disease process and Rx rationale.  I explained to the patient what I considered to be reasonable expectations given their personal situation.  The final treatment plan was arrived at through a shared patient decision making process model.    I personally saw and evaluated the patient, and  participated in the management and treatment plan.  Elspeth Parker, MD Attending Physician, Orthopedic Surgery  This document was dictated using Dragon voice recognition software. A reasonable attempt at proof reading has been made to minimize errors.

## 2024-04-01 ENCOUNTER — Telehealth (HOSPITAL_BASED_OUTPATIENT_CLINIC_OR_DEPARTMENT_OTHER): Payer: Self-pay | Admitting: Physical Therapy

## 2024-04-01 ENCOUNTER — Telehealth: Payer: Self-pay | Admitting: Orthopaedic Surgery

## 2024-04-01 ENCOUNTER — Other Ambulatory Visit (HOSPITAL_BASED_OUTPATIENT_CLINIC_OR_DEPARTMENT_OTHER): Payer: Self-pay | Admitting: Orthopaedic Surgery

## 2024-04-01 DIAGNOSIS — M25552 Pain in left hip: Secondary | ICD-10-CM

## 2024-04-01 NOTE — Telephone Encounter (Signed)
 LVM- calling to schedule Post op PT. Pt lives in [place], requesting call back to schedule at [facility] or closer to home.   Jakobee Brackins C. Chelsye Suhre PT, DPT 04/01/24 9:13 AM

## 2024-04-01 NOTE — Telephone Encounter (Signed)
 PT order placed to [facility]

## 2024-04-01 NOTE — Telephone Encounter (Signed)
 Patient called and said that she can not schedule therapy til she gets back from out of town on September 23. She also said it would have to be in [place] not here because she watches her grandson. CB#[phone]

## 2024-04-06 ENCOUNTER — Encounter (HOSPITAL_BASED_OUTPATIENT_CLINIC_OR_DEPARTMENT_OTHER): Payer: Self-pay | Admitting: Orthopaedic Surgery

## 2024-04-07 ENCOUNTER — Telehealth: Payer: Self-pay | Admitting: Radiology

## 2024-04-07 NOTE — Telephone Encounter (Signed)
 I called patient to find out which PT facility she would like to go to in [place]. She asked that I send referral to [facility].   Referral faxed.  She would like to know how much therapy you would plan for her to have? Please call to advise. [phone]

## 2024-04-08 ENCOUNTER — Other Ambulatory Visit (HOSPITAL_BASED_OUTPATIENT_CLINIC_OR_DEPARTMENT_OTHER): Payer: Self-pay | Admitting: Orthopaedic Surgery

## 2024-04-08 ENCOUNTER — Encounter (HOSPITAL_BASED_OUTPATIENT_CLINIC_OR_DEPARTMENT_OTHER)
Admission: RE | Admit: 2024-04-08 | Discharge: 2024-04-08 | Disposition: A | Discharge status: Home / Self Care | Source: Ambulatory Visit | Attending: Orthopaedic Surgery | Admitting: Orthopaedic Surgery

## 2024-04-08 DIAGNOSIS — Z01818 Encounter for other preprocedural examination: Secondary | ICD-10-CM | POA: Insufficient documentation

## 2024-04-08 DIAGNOSIS — M25551 Pain in right hip: Secondary | ICD-10-CM

## 2024-04-08 LAB — BASIC METABOLIC PANEL WITH GFR
Anion gap: 9 (ref 5–15)
BUN: 18 mg/dL (ref 6–20)
CO2: 25 mmol/L (ref 22–32)
Calcium: 9.3 mg/dL (ref 8.9–10.3)
Chloride: 105 mmol/L (ref 98–111)
Creatinine, Ser: 0.98 mg/dL (ref 0.44–1.00)
GFR, Estimated: >60 mL/min (ref >60)
Glucose, Bld: 127 mg/dL — ABNORMAL HIGH (ref 70–99)
Potassium: 3.8 mmol/L (ref 3.5–5.1)
Sodium: 139 mmol/L (ref 135–145)

## 2024-04-08 NOTE — Telephone Encounter (Signed)
 Order placed to [facility] in [place]

## 2024-04-08 NOTE — Progress Notes (Signed)

## 2024-04-13 ENCOUNTER — Encounter (HOSPITAL_BASED_OUTPATIENT_CLINIC_OR_DEPARTMENT_OTHER): Payer: Self-pay | Admitting: Orthopaedic Surgery

## 2024-04-13 NOTE — Anesthesia Preprocedure Evaluation (Signed)
 Anesthesia Evaluation  Patient identified by MRN, date of birth, ID band Patient awake    Reviewed: Allergy & Precautions, NPO status , Patient's Chart, lab work & pertinent test results, reviewed documented beta blocker date and time   Airway Mallampati: II  TM Distance: >3 FB     Dental no notable dental hx. (+) Teeth Intact, Dental Advisory Given, Caps   Pulmonary neg pulmonary ROS   Pulmonary exam normal breath sounds clear to auscultation       Cardiovascular Pt. on home beta blockers and Pt. on medications + CAD, + Past MI and + Cardiac Stents  Normal cardiovascular exam Rhythm:Regular Rate:Normal  STEMI Anterior 02/01/2015 DES proximal LAD  EKG 04/08/24 NSR, Normal  Echo 02/02/2015 Left ventricle: Distal septal and apical hypokinesis. The cavity    size was normal. Wall thickness was normal. Systolic function was    mildly reduced. The estimated ejection fraction was in the range    of 45% to 50%.  - Atrial septum: No defect or patent foramen ovale was identified.     Cardiac Cath1/04/21  Overall normal left ventricular systolic and diastolic function but with minimal apical hypokinesis, ejection fraction 60%.  LVEDP 19 mmHg.     Neuro/Psych Hx/o Meniere's disease S/P Left cochlear implant  negative psych ROS   GI/Hepatic Neg liver ROS,GERD  ,,  Endo/Other  diabetes, Well Controlled, Type 2, Oral Hypoglycemic Agents  Hyperlipidemia Obesity Metformin last dose 8/25   Renal/GU negative Renal ROS  negative genitourinary   Musculoskeletal Impingement syndrome right hip S/P partial hip arthroplasty   Abdominal   Peds  Hematology Plavix  therapy- last dose 8/25   Anesthesia Other Findings   Reproductive/Obstetrics                              Anesthesia Physical Anesthesia Plan  ASA: 3  Anesthesia Plan: General   Post-op Pain Management: Dilaudid  IV, Precedex and Ofirmev  IV  (intra-op)*   Induction:   PONV Risk Score and Plan: 4 or greater and Treatment may vary due to age or medical condition, Midazolam , Ondansetron  and Dexamethasone   Airway Management Planned: LMA  Additional Equipment: None  Intra-op Plan:   Post-operative Plan: Extubation in OR  Informed Consent: I have reviewed the patients History and Physical, chart, labs and discussed the procedure including the risks, benefits and alternatives for the proposed anesthesia with the patient or authorized representative who has indicated his/her understanding and acceptance.     Dental advisory given  Plan Discussed with: CRNA and Anesthesiologist  Anesthesia Plan Comments:          Anesthesia Quick Evaluation

## 2024-04-14 ENCOUNTER — Encounter (HOSPITAL_BASED_OUTPATIENT_CLINIC_OR_DEPARTMENT_OTHER)
Admission: RE | Disposition: A | Discharge status: Home / Self Care | Payer: Self-pay | Source: Home / Self Care | Attending: Orthopaedic Surgery

## 2024-04-14 ENCOUNTER — Other Ambulatory Visit: Payer: Self-pay

## 2024-04-14 ENCOUNTER — Ambulatory Visit (HOSPITAL_BASED_OUTPATIENT_CLINIC_OR_DEPARTMENT_OTHER): Admitting: Anesthesiology

## 2024-04-14 ENCOUNTER — Ambulatory Visit (HOSPITAL_BASED_OUTPATIENT_CLINIC_OR_DEPARTMENT_OTHER)
Admission: RE | Admit: 2024-04-14 | Discharge: 2024-04-14 | Disposition: A | Discharge status: Home / Self Care | Attending: Orthopaedic Surgery | Admitting: Orthopaedic Surgery

## 2024-04-14 ENCOUNTER — Encounter (HOSPITAL_BASED_OUTPATIENT_CLINIC_OR_DEPARTMENT_OTHER): Payer: Self-pay | Admitting: Orthopaedic Surgery

## 2024-04-14 ENCOUNTER — Ambulatory Visit (HOSPITAL_COMMUNITY)

## 2024-04-14 DIAGNOSIS — E669 Obesity, unspecified: Secondary | ICD-10-CM | POA: Diagnosis not present

## 2024-04-14 DIAGNOSIS — E785 Hyperlipidemia, unspecified: Secondary | ICD-10-CM | POA: Diagnosis not present

## 2024-04-14 DIAGNOSIS — Z7984 Long term (current) use of oral hypoglycemic drugs: Secondary | ICD-10-CM

## 2024-04-14 DIAGNOSIS — I2510 Atherosclerotic heart disease of native coronary artery without angina pectoris: Secondary | ICD-10-CM | POA: Diagnosis not present

## 2024-04-14 DIAGNOSIS — Z9621 Cochlear implant status: Secondary | ICD-10-CM | POA: Diagnosis not present

## 2024-04-14 DIAGNOSIS — Z96642 Presence of left artificial hip joint: Secondary | ICD-10-CM | POA: Diagnosis not present

## 2024-04-14 DIAGNOSIS — E119 Type 2 diabetes mellitus without complications: Secondary | ICD-10-CM | POA: Insufficient documentation

## 2024-04-14 DIAGNOSIS — Z7902 Long term (current) use of antithrombotics/antiplatelets: Secondary | ICD-10-CM | POA: Insufficient documentation

## 2024-04-14 DIAGNOSIS — I252 Old myocardial infarction: Secondary | ICD-10-CM | POA: Diagnosis not present

## 2024-04-14 DIAGNOSIS — Z955 Presence of coronary angioplasty implant and graft: Secondary | ICD-10-CM | POA: Insufficient documentation

## 2024-04-14 DIAGNOSIS — Z6833 Body mass index (BMI) 33.0-33.9, adult: Secondary | ICD-10-CM | POA: Insufficient documentation

## 2024-04-14 DIAGNOSIS — W19XXXA Unspecified fall, initial encounter: Secondary | ICD-10-CM | POA: Diagnosis not present

## 2024-04-14 DIAGNOSIS — Z79899 Other long term (current) drug therapy: Secondary | ICD-10-CM | POA: Diagnosis not present

## 2024-04-14 DIAGNOSIS — M24851 Other specific joint derangements of right hip, not elsewhere classified: Secondary | ICD-10-CM

## 2024-04-14 DIAGNOSIS — M25851 Other specified joint disorders, right hip: Secondary | ICD-10-CM | POA: Insufficient documentation

## 2024-04-14 DIAGNOSIS — M25551 Pain in right hip: Secondary | ICD-10-CM

## 2024-04-14 DIAGNOSIS — Z01818 Encounter for other preprocedural examination: Secondary | ICD-10-CM

## 2024-04-14 DIAGNOSIS — K219 Gastro-esophageal reflux disease without esophagitis: Secondary | ICD-10-CM | POA: Diagnosis not present

## 2024-04-14 HISTORY — PX: HIP ARTHROSCOPY: SHX668

## 2024-04-14 HISTORY — DX: Acute myocardial infarction, unspecified: I21.9

## 2024-04-14 HISTORY — DX: Atherosclerotic heart disease of native coronary artery without angina pectoris: I25.10

## 2024-04-14 HISTORY — DX: Prediabetes: R73.03

## 2024-04-14 LAB — GLUCOSE, CAPILLARY
Glucose-Capillary: 108 mg/dL — ABNORMAL HIGH (ref 70–99)
Glucose-Capillary: 132 mg/dL — ABNORMAL HIGH (ref 70–99)

## 2024-04-14 SURGERY — ARTHROSCOPY HIP
Anesthesia: General | Site: Hip | Laterality: Right

## 2024-04-14 MED ORDER — FENTANYL CITRATE (PF) 100 MCG/2ML IJ SOLN
INTRAMUSCULAR | Status: AC
Start: 2024-04-14 — End: 2024-04-14
  Filled 2024-04-14: qty 2

## 2024-04-14 MED ORDER — ONDANSETRON HCL 4 MG/2ML IJ SOLN: 4.0000 mg | Freq: Once | INTRAMUSCULAR | Status: DC | PRN

## 2024-04-14 MED ORDER — ACETAMINOPHEN 500 MG PO TABS
ORAL_TABLET | ORAL | Status: AC
  Filled 2024-04-14: qty 2

## 2024-04-14 MED ORDER — ACETAMINOPHEN 500 MG PO TABS
1000.0000 mg | ORAL_TABLET | Freq: Once | ORAL | Status: AC
  Administered 2024-04-14: 1000 mg via ORAL

## 2024-04-14 MED ORDER — LIDOCAINE 2% (20 MG/ML) 5 ML SYRINGE
INTRAMUSCULAR | Status: AC
  Filled 2024-04-14: qty 5

## 2024-04-14 MED ORDER — DEXAMETHASONE SODIUM PHOSPHATE 10 MG/ML IJ SOLN
INTRAMUSCULAR | Status: AC
  Filled 2024-04-14: qty 1

## 2024-04-14 MED ORDER — GABAPENTIN 300 MG PO CAPS
300.0000 mg | ORAL_CAPSULE | Freq: Once | ORAL | Status: AC
  Administered 2024-04-14: 300 mg via ORAL

## 2024-04-14 MED ORDER — HYDROMORPHONE HCL 1 MG/ML IJ SOLN
INTRAMUSCULAR | Status: AC
  Filled 2024-04-14: qty 0.5

## 2024-04-14 MED ORDER — ONDANSETRON HCL 4 MG/2ML IJ SOLN
INTRAMUSCULAR | Status: DC | PRN
  Administered 2024-04-14: 4 mg via INTRAVENOUS

## 2024-04-14 MED ORDER — ROCURONIUM BROMIDE 10 MG/ML (PF) SYRINGE
PREFILLED_SYRINGE | INTRAVENOUS | Status: AC
  Filled 2024-04-14: qty 10

## 2024-04-14 MED ORDER — FENTANYL CITRATE (PF) 100 MCG/2ML IJ SOLN
INTRAMUSCULAR | Status: DC | PRN
  Administered 2024-04-14: 100 ug via INTRAVENOUS

## 2024-04-14 MED ORDER — ROCURONIUM BROMIDE 100 MG/10ML IV SOLN
INTRAVENOUS | Status: DC | PRN
Start: 2024-04-14 — End: 2024-04-14
  Administered 2024-04-14: 10 mg via INTRAVENOUS
  Administered 2024-04-14: 70 mg via INTRAVENOUS

## 2024-04-14 MED ORDER — OXYCODONE HCL 5 MG/5ML PO SOLN: 5.0000 mg | Freq: Once | ORAL | Status: DC | PRN

## 2024-04-14 MED ORDER — CEFAZOLIN SODIUM-DEXTROSE 2-4 GM/100ML-% IV SOLN
INTRAVENOUS | Status: AC
  Filled 2024-04-14: qty 100

## 2024-04-14 MED ORDER — GABAPENTIN 300 MG PO CAPS
ORAL_CAPSULE | ORAL | Status: AC
  Filled 2024-04-14: qty 1

## 2024-04-14 MED ORDER — TRANEXAMIC ACID-NACL 1000-0.7 MG/100ML-% IV SOLN
INTRAVENOUS | Status: AC
  Filled 2024-04-14: qty 100

## 2024-04-14 MED ORDER — HYDROMORPHONE HCL 1 MG/ML IJ SOLN
INTRAMUSCULAR | Status: AC
Start: 2024-04-14 — End: 2024-04-14
  Filled 2024-04-14: qty 0.5

## 2024-04-14 MED ORDER — SODIUM CHLORIDE 0.9 % IR SOLN
Status: DC | PRN
  Administered 2024-04-14: 6000 mL

## 2024-04-14 MED ORDER — PROPOFOL 10 MG/ML IV BOLUS
INTRAVENOUS | Status: AC
  Filled 2024-04-14: qty 20

## 2024-04-14 MED ORDER — DEXAMETHASONE SODIUM PHOSPHATE 4 MG/ML IJ SOLN
INTRAMUSCULAR | Status: DC | PRN
  Administered 2024-04-14: 5 mg via INTRAVENOUS

## 2024-04-14 MED ORDER — BUPIVACAINE HCL 0.25 % IJ SOLN
INTRAMUSCULAR | Status: DC | PRN
  Administered 2024-04-14: 20 mL via INTRA_ARTICULAR

## 2024-04-14 MED ORDER — DROPERIDOL 2.5 MG/ML IJ SOLN: 0.6250 mg | Freq: Once | INTRAMUSCULAR | Status: DC | PRN

## 2024-04-14 MED ORDER — SUGAMMADEX SODIUM 200 MG/2ML IV SOLN
INTRAVENOUS | Status: DC | PRN
  Administered 2024-04-14: 400 mg via INTRAVENOUS

## 2024-04-14 MED ORDER — BUPIVACAINE HCL (PF) 0.25 % IJ SOLN
INTRAMUSCULAR | Status: AC
  Filled 2024-04-14: qty 90

## 2024-04-14 MED ORDER — HYDROMORPHONE HCL 1 MG/ML IJ SOLN
0.2500 mg | INTRAMUSCULAR | Status: DC | PRN
  Administered 2024-04-14 (×3): 0.5 mg via INTRAVENOUS

## 2024-04-14 MED ORDER — MIDAZOLAM HCL 5 MG/5ML IJ SOLN
INTRAMUSCULAR | Status: DC | PRN
  Administered 2024-04-14: 2 mg via INTRAVENOUS

## 2024-04-14 MED ORDER — TRANEXAMIC ACID-NACL 1000-0.7 MG/100ML-% IV SOLN: 1000.0000 mg | INTRAVENOUS | Status: DC

## 2024-04-14 MED ORDER — PROPOFOL 10 MG/ML IV BOLUS
INTRAVENOUS | Status: DC | PRN
  Administered 2024-04-14: 160 mg via INTRAVENOUS

## 2024-04-14 MED ORDER — ONDANSETRON HCL 4 MG/2ML IJ SOLN
INTRAMUSCULAR | Status: AC
  Filled 2024-04-14: qty 2

## 2024-04-14 MED ORDER — OXYCODONE HCL 5 MG PO TABS: 5.0000 mg | ORAL_TABLET | Freq: Once | ORAL | Status: DC | PRN

## 2024-04-14 MED ORDER — LACTATED RINGERS IV SOLN: INTRAVENOUS | Status: DC

## 2024-04-14 MED ORDER — EPHEDRINE SULFATE (PRESSORS) 50 MG/ML IJ SOLN
INTRAMUSCULAR | Status: DC | PRN
  Administered 2024-04-14: 15 mg via INTRAVENOUS
  Administered 2024-04-14: 10 mg via INTRAVENOUS

## 2024-04-14 MED ORDER — CEFAZOLIN SODIUM-DEXTROSE 2-4 GM/100ML-% IV SOLN
2.0000 g | INTRAVENOUS | Status: AC
  Administered 2024-04-14: 2 g via INTRAVENOUS

## 2024-04-14 MED ORDER — MIDAZOLAM HCL 2 MG/2ML IJ SOLN
INTRAMUSCULAR | Status: AC
  Filled 2024-04-14: qty 2

## 2024-04-14 MED ORDER — LIDOCAINE HCL (CARDIAC) PF 100 MG/5ML IV SOSY
PREFILLED_SYRINGE | INTRAVENOUS | Status: DC | PRN
Start: 2024-04-14 — End: 2024-04-14
  Administered 2024-04-14: 60 mg via INTRAVENOUS

## 2024-04-14 SURGICAL SUPPLY — 55 items
ANCHOR SUT 1.4 FLEX (Anchor) IMPLANT
BIT DRILL FLEX NANOTACK (BIT) IMPLANT
BLADE SAMURAI STR FULL RADIUS (BLADE) IMPLANT
BLADE SURG 11 STRL SS (BLADE) ×1 IMPLANT
CANISTER SUCT 1200ML W/VALVE (MISCELLANEOUS) ×1 IMPLANT
CANNULA OBTURATOR FLOWPORT ST5 (CANNULA) IMPLANT
CHLORAPREP W/TINT 26 (MISCELLANEOUS) ×1 IMPLANT
COOLER ICEMAN CLASSIC (MISCELLANEOUS) ×1 IMPLANT
COVER BACK TABLE 60X90IN (DRAPES) ×1 IMPLANT
COVER MAYO STAND STRL (DRAPES) ×2 IMPLANT
DERMABOND ADVANCED .7 DNX12 (GAUZE/BANDAGES/DRESSINGS) IMPLANT
DISSECTOR 4.2MMX19CM HL (MISCELLANEOUS) ×1 IMPLANT
DRAPE C-ARM 42X72 X-RAY (DRAPES) ×1 IMPLANT
DRAPE STERI IOBAN 125X83 (DRAPES) IMPLANT
DRAPE U-SHAPE 47X51 STRL (DRAPES) ×2 IMPLANT
DRSG TEGADERM 4X4.75 (GAUZE/BANDAGES/DRESSINGS) ×3 IMPLANT
FEE RENTAL EQUIP HIP INSTR KIT (INSTRUMENTS) IMPLANT
GAUZE PAD ABD 8X10 STRL (GAUZE/BANDAGES/DRESSINGS) IMPLANT
GAUZE SPONGE 4X4 12PLY STRL (GAUZE/BANDAGES/DRESSINGS) ×1 IMPLANT
GAUZE XEROFORM 1X8 LF (GAUZE/BANDAGES/DRESSINGS) ×1 IMPLANT
GLOVE BIO SURGEON STRL SZ 6 (GLOVE) ×2 IMPLANT
GLOVE BIO SURGEON STRL SZ7.5 (GLOVE) ×2 IMPLANT
GLOVE BIOGEL PI IND STRL 6.5 (GLOVE) ×1 IMPLANT
GLOVE BIOGEL PI IND STRL 8 (GLOVE) ×1 IMPLANT
GOWN STRL REUS W/ TWL LRG LVL3 (GOWN DISPOSABLE) ×2 IMPLANT
GOWN STRL REUS W/TWL XL LVL3 (GOWN DISPOSABLE) ×1 IMPLANT
INSTRUMENT ORTHO TEXT HIP FEM (INSTRUMENTS) IMPLANT
KIT PATIENT POSITION MEDIUM (KITS) IMPLANT
KIT PORTAL ENTRY HIP ACCESS (KITS) IMPLANT
MANIFOLD NEPTUNE II (INSTRUMENTS) ×1 IMPLANT
NDL HYPO 22X1.5 SAFETY MO (MISCELLANEOUS) IMPLANT
NDL INJECTOR II CARTRIDGE (MISCELLANEOUS) IMPLANT
NDL SPNL 18GX3.5 QUINCKE PK (NEEDLE) ×1 IMPLANT
NDL SUT 6 .5 CRC .975X.05 MAYO (NEEDLE) IMPLANT
NEEDLE HYPO 22X1.5 SAFETY MO (MISCELLANEOUS) IMPLANT
NEEDLE INJECTOR II CARTRIDGE (MISCELLANEOUS) ×1 IMPLANT
NEEDLE SPNL 18GX3.5 QUINCKE PK (NEEDLE) ×1 IMPLANT
PACK BASIN DAY SURGERY FS (CUSTOM PROCEDURE TRAY) ×1 IMPLANT
PAD COLD SHLDR WRAP-ON (PAD) ×1 IMPLANT
PASSER SUT 1.5D CRESCENT (INSTRUMENTS) IMPLANT
SPIKE FLUID TRANSFER (MISCELLANEOUS) IMPLANT
SPONGE T-LAP 18X18 ~~LOC~~+RFID (SPONGE) IMPLANT
SUCTION TUBE FRAZIER 10FR DISP (SUCTIONS) IMPLANT
SUT ETHILON 3 0 PS 1 (SUTURE) ×1 IMPLANT
SUT VIC AB 0 CT1 27XBRD ANBCTR (SUTURE) IMPLANT
SUT VIC AB 2-0 CT1 TAPERPNT 27 (SUTURE) IMPLANT
SUT XBRAID 1.4 BLUE (SUTURE) IMPLANT
SUTURE FIBERWR #2 38 T-5 BLUE (SUTURE) IMPLANT
SUTURE TAPE 1.3 FIBERLOP 20 ST (SUTURE) IMPLANT
SYR 20ML LL LF (SYRINGE) IMPLANT
SYR 50ML LL SCALE MARK (SYRINGE) ×1 IMPLANT
TOWEL GREEN STERILE FF (TOWEL DISPOSABLE) ×2 IMPLANT
TUBE CONNECTING 20X1/4 (TUBING) ×3 IMPLANT
TUBING ARTHROSCOPY IRRIG 16FT (MISCELLANEOUS) ×1 IMPLANT
WAND APOLLO RF 50D ABLATOR (BUR) ×1 IMPLANT

## 2024-04-14 NOTE — Interval H&P Note (Signed)
 History and Physical Interval Note:  04/14/2024 6:57 AM  Norma Bell  has presented today for surgery, with the diagnosis of RIGHT HIP IMPINGEMENT.  The various methods of treatment have been discussed with the patient and family. After consideration of risks, benefits and other options for treatment, the patient has consented to  Procedure(s) with comments: ARTHROSCOPY HIP (Right) - RIGHT HIP ARTHROSCOPY WITH PINCER DEBRIDEMENT as a surgical intervention.  The patient's history has been reviewed, patient examined, no change in status, stable for surgery.  I have reviewed the patient's chart and labs.  Questions were answered to the patient's satisfaction.     [name]

## 2024-04-14 NOTE — Anesthesia Postprocedure Evaluation (Signed)
 Anesthesia Post Note  Patient: Norma Bell  Procedure(s) Performed: ARTHROSCOPY HIP (Right: Hip)     Patient location during evaluation: PACU Anesthesia Type: General Level of consciousness: awake and alert and oriented Pain management: pain level controlled Vital Signs Assessment: post-procedure vital signs reviewed and stable Respiratory status: spontaneous breathing, nonlabored ventilation and respiratory function stable Cardiovascular status: blood pressure returned to baseline and stable Postop Assessment: no apparent nausea or vomiting Anesthetic complications: no   No notable events documented.  Last Vitals:  Vitals:   04/14/24 0930 04/14/24 0956  BP: 123/66 135/77  Pulse: 76 69  Resp: 14 18  Temp:  (!) 36.1 C  SpO2: 97% 98%    Last Pain:  Vitals:   04/14/24 0956  TempSrc: Oral  PainSc:                  Cattaleya Wien A.

## 2024-04-14 NOTE — Brief Op Note (Signed)
   Brief Op Note  Date of Surgery: 04/14/2024  Preoperative Diagnosis: RIGHT HIP IMPINGEMENT  Postoperative Diagnosis: same  Procedure: Procedure(s): ARTHROSCOPY HIP  Implants: Implant Name Type Inv. Item Serial No. Manufacturer Lot No. LRB No. Used Action  ANCHOR SUT 1.4 FLEX - [id number] Anchor ANCHOR SUT 1.4 FLEX  [employer] [id number] Right 2 Implanted    Surgeons: Surgeon(s): Genelle Standing, MD  Anesthesia: General    Estimated Blood Loss: See anesthesia record  Complications: None  Condition to PACU: Stable  Standing LITTIE Genelle, MD 04/14/2024 8:33 AM

## 2024-04-14 NOTE — Op Note (Signed)
 Date of Surgery: 04/14/2024  INDICATIONS: Norma Bell is a 58 y.o.-year-old female with right hip pincer impingement  The risk and benefits of the procedure were discussed in detail and documented in the pre-operative evaluation.   PREOPERATIVE DIAGNOSIS: 1. Right hip femoral acetabular impingement  POSTOPERATIVE DIAGNOSIS: Same.  PROCEDURE: 1.  Right hip pincer debridement with labral repair  SURGEON: Elspeth LITTIE Parker MD  ASSISTANT: Conley Dawson, ATC  ANESTHESIA:  general  IV FLUIDS AND URINE: See anesthesia record.  ANTIBIOTICS: Ancef   ESTIMATED BLOOD LOSS: 10 mL.  IMPLANTS:  Implant Name Type Inv. Item Serial No. Manufacturer Lot No. LRB No. Used Action  ANCHOR SUT 1.4 FLEX - [id number] Anchor ANCHOR SUT 1.4 FLEX  [employer] [id number] Right 2 Implanted    DRAINS: None  CULTURES: None  COMPLICATIONS: none  DESCRIPTION OF PROCEDURE:   Cartilage Intact femoral and acetabular cartilage   Labrum Plastic, frayed appearing with large pincer lesion   Boundaries of labral tear Convention (3 o'clock anterior, 9 o'clock posterior) Anterior boundary: 3 o'clock Posterior boundary: 1 o'clock   OPERATIVE REPORT:  The patient was brought to the operating room, placed supine on the operating table, and bony prominences were padded.  The traction boots were applied with padding to ensure that safe traction could be applied through the feet.  The contralateral limb was abducted maximally and light traction was applied.  The operative leg was brought into neutral position.  The flouroscopic c-arm was brought between the legs for an AP image.  The patient was prepped and draped in a sterile fashion.  Time-out was performed and landmarks were identified. Traction was obtained and care was taken to ensure the least amount of force necessary to allow safe access to the joint of 8-86mm.  This was checked with fluoroscopy.    Next we placed an anterolateral portal under the assistance  of fluoroscopy.  First, fluoroscopy was used to estimate the trajectory and starting point.  A 5mm incision with a #11 blade was made and a straight hemostat was used to dilate the portal through the appropriate tract.  We then placed a 14-gauge hypodermic needle with careful technique to be as close to the femoral head as possible and parallel to the sorcele to ensure no iatrogenic damage to the labrum.  This released the negative pressure environment and the amount of traction was adjusted to maintain the 8-71mm of distraction.  A nitinol wire was placed through the needle and flouroscopy was used to ensure it extended to the medial wall of the acetabulum.  The Flowport from [employer] was placed over the wire and the nitinol wire was retracted to just inside the capsule during insertion of the dilator and cannula to minimize the risk of breakage. The arthroscope was placed next and we visualized the anterior triangle.     We then placed the anterior portal under direct visualization using the technique described above.  This was safely placed as well without damage to the labrum or femoral head.  We then switched our arthroscope to the anterior portal to ensure we were not through the labrum - we were safely through the capsule only.  We then proceeded with periportal capsulotomies utilizing the Samurai blade in each portal without connecting the two.  We identified the anterior inferior iliac spine proximally, the psoas tendon medially and the rectus tendon laterally as landmarks.  We then proceeded with a diagnostic arthroscopy - the results can be found in the findings  section above.    We then used the radiofrequency device to clear the superior acetabulum and expose the subspinous region.  Next we exposed the acetabular rim leaving the chondral labral junction intact. The acetabular rim/subspinous region was reshaped with 5.5 mm bur.   When adequate reshaping was obtained we then proceeded  with the labral repair. We placed 2 anchors at the 1:00 and 2:30 positions. The sutures were passed using the crescent Nanopass from [employer].  This resulted in anatomic labral repair.  We debrided the loose cartilage at the rim and residual degenerative labral tissue.  Traction was let down with total traction time of 22 minutes.     Finally, we performed a complete capsular closure with tape suture.  She was replaced in the anterior and posterior limb of the reported capsulotomy with excellent apposition. We then removed the arthroscope and closed the incisions with 3-0 nylon simple stitches.  A sterile dressing was applied..  The patient was awakened from anesthesia and transferred to PACU in stable condition. Postoperative care includes:       POSTOPERATIVE PLAN:    Weight bearing as tolerated operative extremity Formal physical therapy will begin immediately within the first weeks of surgery ASA 325 Daily for DVT prophylaxis      Elspeth LITTIE Parker, MD 8:34 AM

## 2024-04-14 NOTE — Anesthesia Procedure Notes (Signed)
 Procedure Name: Intubation Date/Time: 04/14/2024 7:48 AM  Performed by: Burnard Rosaline HERO, CRNAPre-anesthesia Checklist: Patient identified, Emergency Drugs available, Suction available and Patient being monitored Patient Re-evaluated:Patient Re-evaluated prior to induction Oxygen Delivery Method: Circle system utilized Preoxygenation: Pre-oxygenation with 100% oxygen Induction Type: IV induction Ventilation: Mask ventilation without difficulty Laryngoscope Size: Mac and 4 Grade View: Grade I Tube type: Oral Number of attempts: 1 Airway Equipment and Method: Stylet and Oral airway Placement Confirmation: ETT inserted through vocal cords under direct vision, positive ETCO2, breath sounds checked- equal and bilateral and CO2 detector Secured at: 21 cm Tube secured with: Tape Dental Injury: Teeth and Oropharynx as per pre-operative assessment

## 2024-04-14 NOTE — Discharge Instructions (Addendum)
 Discharge Instructions    Attending Surgeon: Elspeth Parker, MD Office Phone Number: [phone]   Diagnosis and Procedures:    Surgeries Performed: Right hip pincer debridement with labral repair  Discharge Plan:    Diet: Resume usual diet. Begin with light or bland foods.  Drink plenty of fluids.  Activity:  Weight bearing as tolerated right leg. You are advised to go home directly from the hospital or surgical center. Restrict your activities.  GENERAL INSTRUCTIONS: 1.  Please apply ice to your wound to help with swelling and inflammation. This will improve your comfort and your overall recovery following surgery.     2. Please call Dr. Danetta office at [phone] with questions Monday-Friday during business hours. If no one answers, please leave a message and someone should get back to the patient within 24 hours. For emergencies please call 911 or proceed to the emergency room.   3. Patient to notify surgical team if experiences any of the following: Bowel/Bladder dysfunction, uncontrolled pain, nerve/muscle weakness, incision with increased drainage or redness, nausea/vomiting and Fever greater than 101.0 F.  Be alert for signs of infection including redness, streaking, odor, fever or chills. Be alert for excessive pain or bleeding and notify your surgeon immediately.  WOUND INSTRUCTIONS:   Leave your dressing, cast, or splint in place until your post operative visit.  Keep it clean and dry.  Always keep the incision clean and dry until the staples/sutures are removed. If there is no drainage from the incision you should keep it open to air. If there is drainage from the incision you must keep it covered at all times until the drainage stops  Do not soak in a bath tub, hot tub, pool, lake or other body of water until 21 days after your surgery and your incision is completely dry and healed.  If you have removable sutures (or staples) they must be removed 10-14  days (unless otherwise instructed) from the day of your surgery.     1)  Elevate the extremity as much as possible.  2)  Keep the dressing clean and dry.  3)  Please call us  if the dressing becomes wet or dirty.  4)  If you are experiencing worsening pain or worsening swelling, please call.     MEDICATIONS: Resume all previous home medications at the previous prescribed dose and frequency unless otherwise noted Start taking the  pain medications on an as-needed basis as prescribed  Please taper down pain medication over the next week following surgery.  Ideally you should not require a refill of any narcotic pain medication.  Take pain medication with food to minimize nausea. In addition to the prescribed pain medication, you may take over-the-counter pain relievers such as Tylenol .  Do NOT take additional tylenol  if your pain medication already has tylenol  in it.  Aspirin  81mg /Plavix   Narcotic policy: Per [facility] clinic policy, our goal is ensure optimal postoperative pain control with a multimodal pain management strategy. For all [facility] patients, our goal is to wean post-operative narcotic medications by 6 weeks post-operatively, and many times sooner. If this is not possible due to utilization of pain medication prior to surgery, your [facility] doctor will support your acute post-operative pain control for the first 6 weeks postoperatively, with a plan to transition you back to your primary pain team following that. Norma Bell will work to ensure a Therapist, occupational.       FOLLOWUP INSTRUCTIONS: 1. Follow up at the Physical Therapy Clinic  3-4 days following surgery. This appointment should be scheduled unless other arrangements have been made.The Physical Therapy scheduling number is [phone] if an appointment has not already been arranged.  2. Contact Dr. Danetta office during office hours at [phone] or the practice after hours line at [phone] for non-emergencies. For  medical emergencies call 911.   Discharge Location: Home    Post Anesthesia Home Care Instructions  Activity: Get plenty of rest for the remainder of the day. A responsible individual must stay with you for 24 hours following the procedure.  For the next 24 hours, DO NOT: -Drive a car -Advertising copywriter -Drink alcoholic beverages -Take any medication unless instructed by your physician -Make any legal decisions or sign important papers.  Meals: Start with liquid foods such as gelatin or soup. Progress to regular foods as tolerated. Avoid greasy, spicy, heavy foods. If nausea and/or vomiting occur, drink only clear liquids until the nausea and/or vomiting subsides. Call your physician if vomiting continues.  Special Instructions/Symptoms: Your throat may feel dry or sore from the anesthesia or the breathing tube placed in your throat during surgery. If this causes discomfort, gargle with warm salt water. The discomfort should disappear within 24 hours.  If you had a scopolamine patch placed behind your ear for the management of post- operative nausea and/or vomiting:  1. The medication in the patch is effective for 72 hours, after which it should be removed.  Wrap patch in a tissue and discard in the trash. Wash hands thoroughly with soap and water. 2. You may remove the patch earlier than 72 hours if you experience unpleasant side effects which may include dry mouth, dizziness or visual disturbances. 3. Avoid touching the patch. Wash your hands with soap and water after contact with the patch.      Next dose of Tylenol  may be given 12:40pm if needed.

## 2024-04-14 NOTE — Transfer of Care (Signed)
 Immediate Anesthesia Transfer of Care Note  Patient: Norma Bell  Procedure(s) Performed: ARTHROSCOPY HIP (Right: Hip)  Patient Location: PACU  Anesthesia Type:General  Level of Consciousness: awake, drowsy, and patient cooperative  Airway & Oxygen Therapy: Patient Spontanous Breathing and Patient connected to face mask oxygen  Post-op Assessment: Report given to RN and Post -op Vital signs reviewed and stable  Post vital signs: Reviewed and stable  Last Vitals:  Vitals Value Taken Time  BP 135/70 04/14/24 08:42  Temp 36.2 C 04/14/24 08:42  Pulse 73 04/14/24 08:43  Resp 14 04/14/24 08:43  SpO2 99 % 04/14/24 08:43  Vitals shown include unfiled device data.  Last Pain:  Vitals:   04/14/24 0634  TempSrc: Temporal  PainSc: 2       Patients Stated Pain Goal: 2 (04/14/24 9365)  Complications: No notable events documented.

## 2024-04-15 ENCOUNTER — Encounter (HOSPITAL_BASED_OUTPATIENT_CLINIC_OR_DEPARTMENT_OTHER): Payer: Self-pay | Admitting: Orthopaedic Surgery

## 2024-04-29 ENCOUNTER — Ambulatory Visit (INDEPENDENT_AMBULATORY_CARE_PROVIDER_SITE_OTHER): Admitting: Orthopaedic Surgery

## 2024-04-29 DIAGNOSIS — M25551 Pain in right hip: Secondary | ICD-10-CM

## 2024-04-29 NOTE — Progress Notes (Signed)
 Post Operative Evaluation    Procedure/Date of Surgery: Right hip pincer debridement 9/2  Interval History:    Presents 2 weeks status post above procedure.  Overall she is doing extremely well.  Range of motion and strength are coming along nicely.  Denies any pain in the groin or hip   PMH/PSH/Family History/Social History/Meds/Allergies:    Past Medical History:  Diagnosis Date   Allergy    Coronary artery disease    HOH (hard of hearing)    left cochlear implant   Hyperlipidemia LDL goal <70    Meniere disease    Myocardial infarction [facility])    Pre-diabetes    Past Surgical History:  Procedure Laterality Date   APPENDECTOMY     CARDIAC CATHETERIZATION N/A 02/01/2015   Procedure: Left Heart Cath and Coronary Angiography;  Surgeon: Candyce GORMAN Reek, MD;  Location: [facility] INVASIVE CV LAB;  Service: Cardiovascular;  Laterality: N/A;   CARDIAC CATHETERIZATION  02/01/2015   Procedure: Coronary Stent Intervention;  Surgeon: Candyce GORMAN Reek, MD;  Location: [facility] INVASIVE CV LAB;  Service: Cardiovascular;;   COCHLEAR IMPLANT     cochlear implant left ear     HIP ARTHROSCOPY Right 04/14/2024   Procedure: ARTHROSCOPY HIP;  Surgeon: Genelle Standing, MD;  Location: [facility];  Service: Orthopedics;  Laterality: Right;  RIGHT HIP ARTHROSCOPY WITH PINCER DEBRIDEMENT   JOINT REPLACEMENT     PARTIAL HIP ARTHROPLASTY Left    stunts in both ears     TUBAL LIGATION     Social History   Socioeconomic History   Marital status: Married    Spouse name: Not on file   Number of children: Not on file   Years of education: Not on file   Highest education level: Not on file  Occupational History   Occupation: student    Employer: STUDENT  Tobacco Use   Smoking status: Never   Smokeless tobacco: Never  Substance and Sexual Activity   Alcohol use: No   Drug use: No   Sexual activity: Yes    Birth control/protection: Surgical, Pill  Other  Topics Concern   Not on file  Social History Narrative   Exercises 3x's week 30 min sessions.   Social Drivers of Corporate investment banker Strain: Not on file  Food Insecurity: Not on file  Transportation Needs: Not on file  Physical Activity: Not on file  Stress: Not on file  Social Connections: Not on file   Family History  Problem Relation Age of Onset   Hypertension Mother    Hypertension Father    Allergies  Allergen Reactions   Sucralfate Itching and Swelling    Face/tongue    Ranitidine Swelling    flushing   Prilosec [Omeprazole] Swelling   Other Rash    Oranges, grapes, additives, preservatives, and Granny Smith apples (red apples OK). - Breaks out in what looks like bites. Some trouble breathing.   Current Outpatient Medications  Medication Sig Dispense Refill   amitriptyline  (ELAVIL ) 75 MG tablet TAKE 1 TABLET BY MOUTH AT BEDTIME 90 tablet 3   aspirin  EC 81 MG tablet Take 81 mg by mouth daily. Swallow whole.     atorvastatin  (LIPITOR ) 80 MG tablet TAKE 1 TABLET BY MOUTH ONCE DAILY AT 6 PM. PLEASE MAKE OVERDUE APPT WITH DR.  SMITH BEFORE MORE REFILLS. 2ND ATTEMPT 15 tablet 0   carvedilol  (COREG ) 3.125 MG tablet TAKE 1 TABLET BY MOUTH TWICE DAILY WITH FOOD. 180 tablet 3   clopidogrel  (PLAVIX ) 75 MG tablet Take 1 tablet (75 mg total) by mouth daily. 90 tablet 3   metFORMIN (GLUCOPHAGE) 500 MG tablet Take by mouth 2 (two) times daily with a meal.     nitroGLYCERIN  (NITROSTAT ) 0.4 MG SL tablet Place 1 tablet (0.4 mg total) under the tongue every 5 (five) minutes x 3 doses as needed for chest pain. 25 tablet 6   oxyCODONE  (ROXICODONE ) 5 MG immediate release tablet Take 1 tablet (5 mg total) by mouth every 4 (four) hours as needed for severe pain (pain score 7-10) or breakthrough pain. 10 tablet 0   No current facility-administered medications for this visit.   No results found.  Review of Systems:   A ROS was performed including pertinent positives and negatives as  documented in the HPI.   Musculoskeletal Exam:    There were no vitals taken for this visit.  Right hip incisions are well-appearing without erythema or drainage.  30 degrees internal/external rotation of the hip is without pain good abduction strength distally sensory exam is intact  Imaging:      I personally reviewed and interpreted the radiographs.   Assessment:   2 weeks status post right hip pincer debridement labral repair doing extremely well.  At this time she will continue to progress with range of motion activity as tolerated and I will plan to see her back in 4 weeks for reassessment  Plan :    - Return to clinic 4 weeks for reassessment      I personally saw and evaluated the patient, and participated in the management and treatment plan.  Elspeth Parker, MD Attending Physician, Orthopedic Surgery  This document was dictated using Dragon voice recognition software. A reasonable attempt at proof reading has been made to minimize errors.

## 2024-05-07 ENCOUNTER — Encounter (HOSPITAL_BASED_OUTPATIENT_CLINIC_OR_DEPARTMENT_OTHER): Admitting: Orthopaedic Surgery

## 2024-05-28 ENCOUNTER — Encounter (HOSPITAL_BASED_OUTPATIENT_CLINIC_OR_DEPARTMENT_OTHER): Admitting: Student

## 2024-05-28 ENCOUNTER — Encounter (HOSPITAL_BASED_OUTPATIENT_CLINIC_OR_DEPARTMENT_OTHER): Admitting: Orthopaedic Surgery

## 2024-06-15 ENCOUNTER — Encounter: Payer: Self-pay | Admitting: Radiology
